# Patient Record
Sex: Male | Born: 1995
Health system: Southern US, Community
[De-identification: ages and names within clinical notes are randomized; demographics above are authoritative.]

## PROBLEM LIST (undated history)

## (undated) DIAGNOSIS — R0981 Nasal congestion: Secondary | ICD-10-CM

## (undated) DIAGNOSIS — J3501 Chronic tonsillitis: Secondary | ICD-10-CM

## (undated) DIAGNOSIS — F909 Attention-deficit hyperactivity disorder, unspecified type: Secondary | ICD-10-CM

## (undated) HISTORY — DX: Attention-deficit hyperactivity disorder, unspecified type: F90.9

## (undated) HISTORY — PX: TYMPANOSTOMY TUBE PLACEMENT: SHX32

---
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
## 2015-03-06 ENCOUNTER — Other Ambulatory Visit (HOSPITAL_COMMUNITY): Payer: Self-pay | Admitting: Interventional Radiology

## 2015-03-06 ENCOUNTER — Other Ambulatory Visit: Payer: Self-pay | Admitting: Internal Medicine

## 2015-03-06 DIAGNOSIS — N500 Atrophy of testis: Secondary | ICD-10-CM

## 2015-03-06 DIAGNOSIS — I861 Scrotal varices: Secondary | ICD-10-CM

## 2015-04-23 ENCOUNTER — Ambulatory Visit
Admission: RE | Admit: 2015-04-23 | Discharge: 2015-04-23 | Disposition: A | Discharge status: Home / Self Care | Payer: BLUE CROSS/BLUE SHIELD | Source: Ambulatory Visit | Attending: Internal Medicine | Admitting: Internal Medicine

## 2015-04-23 DIAGNOSIS — N500 Atrophy of testis: Secondary | ICD-10-CM

## 2015-04-23 DIAGNOSIS — I861 Scrotal varices: Secondary | ICD-10-CM

## 2015-04-23 NOTE — Progress Notes (Signed)
Chief Complaint: Chief Complaint  Patient presents with  . Follow-up    surveillance of Left Testicular Varicocele    History of Present Illness: Jay Ross is a 19 y.o. male seen previously on 12/24/2014 for evaluation of a symptomatic left-sided varicocele. Since his prior evaluation, the patient states that his symptoms of scrotal discomfort are significantly improved. He still experiences a "heavy" sensation in his scrotum after running or strenuous exercise which goes away with rest.  Past Medical History  Diagnosis Date  . ADHD (attention deficit hyperactivity disorder)   . OM (otitis media)   . Hx of varicella   . Injury, ankle 01/30/2012  . Minor head injury, initial encounter 02/22/2012  . Fracture of finger, distal phalanx, right, closed 12/02/2010    xray shows non displaced fx  will do referral    . Head injury without concussion or intracranial hemorrhage 01/30/2012    Past Surgical History  Procedure Laterality Date  . Myringotomy      Allergies: Adderall xr  Medications: Prior to Admission medications   Not on File     Family History  Problem Relation Age of Onset  . Allergies Brother   . Anxiety disorder Sister   . Other Father     back problems and sis  . ADD / ADHD Father     maternal aunt 2 cousins   . Asthma      History   Social History  . Marital Status: Single    Spouse Name: N/A  . Number of Children: N/A  . Years of Education: N/A   Social History Main Topics  . Smoking status: Never Smoker   . Smokeless tobacco: Never Used  . Alcohol Use: No  . Drug Use: No  . Sexual Activity: Not on file   Other Topics Concern  . 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  All other systems are negative.  Review of Systems  Vital Signs: BP 105/53 mmHg  Pulse 79  Temp(Src) 97.5 F (36.4 C) (Oral)  Resp 14  SpO2 98%  Physical Exam  Imaging: No results found.  Labs:  CBC:  Recent Labs  11/25/14 0942  WBC 4.8  HGB 14.9  HCT 43.7  PLT 178.0    COAGS: No results for input(s): INR, APTT in the last 8760 hours.  BMP:  Recent Labs  11/25/14 0942  NA 140  K 3.9  CL 103  CO2 30  GLUCOSE 73  BUN 10  CALCIUM 9.8  CREATININE 1.01    LIVER FUNCTION TESTS:  Recent Labs  11/25/14 0942  BILITOT 0.7  AST 16  ALT 10  ALKPHOS 75  PROT 6.5  ALBUMIN 4.4     Assessment and Plan:  [name] has significant improvement in symptoms of scrotal discomfort since prior evaluation 3 months ago. At this point, I did not recommend immediate treatment of the varicocele. I recommended that he continue to perform self-examination to assess the varicocele and size of the left testicle compared to the right. He currently has only mild discrepancy  in size of the testicles. He will contact me if he has any significant worsening of scrotal pain or notices further decrease in size of the left testicle compared to the right.   SignedIrish Lack T 04/23/2015, 5:22 PM     I spent a total of 15 minutes in face to face in clinical consultation, greater than 50% of which was counseling/coordinating care for left varicocele.

## 2015-07-02 ENCOUNTER — Other Ambulatory Visit (HOSPITAL_COMMUNITY): Payer: Self-pay | Admitting: Interventional Radiology

## 2015-07-02 DIAGNOSIS — I861 Scrotal varices: Secondary | ICD-10-CM

## 2015-07-10 ENCOUNTER — Inpatient Hospital Stay: Admission: RE | Admit: 2015-07-10 | Payer: BLUE CROSS/BLUE SHIELD | Source: Ambulatory Visit

## 2015-07-30 ENCOUNTER — Ambulatory Visit
Admission: RE | Admit: 2015-07-30 | Discharge: 2015-07-30 | Disposition: A | Discharge status: Home / Self Care | Payer: BLUE CROSS/BLUE SHIELD | Source: Ambulatory Visit | Attending: Interventional Radiology | Admitting: Interventional Radiology

## 2015-07-30 DIAGNOSIS — I861 Scrotal varices: Secondary | ICD-10-CM

## 2015-08-14 NOTE — Progress Notes (Signed)
Chief Complaint: Left varicocele.  History of Present Illness: Jay Ross is a 19 y.o. male previously evaluated for symptomatic left varicocele with associated evidence of mild testicular atrophy. He returns for follow-up. He states that the left varicocele remains asymptomatic and that he has had no recurrence of pain or discomfort. He is not experiencing any discomfort with exercise. By self examination, he has not noticed any difference in the varicocele or size of the left testicle.  Past Medical History  Diagnosis Date  . ADHD (attention deficit hyperactivity disorder)   . OM (otitis media)   . Hx of varicella   . Injury, ankle 01/30/2012  . Minor head injury, initial encounter 02/22/2012  . Fracture of finger, distal phalanx, right, closed 12/02/2010    xray shows non displaced fx  will do referral    . Head injury without concussion or intracranial hemorrhage 01/30/2012    Past Surgical History  Procedure Laterality Date  . Myringotomy      Allergies: Adderall xr  Medications: Prior to Admission medications   Not on File     Family History  Problem Relation Age of Onset  . Allergies Brother   . Anxiety disorder Sister   . Other Father     back problems and sis  . ADD / ADHD Father     maternal aunt 2 cousins   . Asthma      Social History   Social History  . Marital Status: Single    Spouse Name: N/A  . Number of Children: N/A  . Years of Education: N/A   Social History Main Topics  . Smoking status: Never Smoker   . Smokeless tobacco: Never Used  . Alcohol Use: No  . Drug Use: No  . Sexual Activity: Not on file   Other Topics Concern  . 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  All other systems are negative.  Review of Systems  Constitutional: Negative.   Gastrointestinal: Negative.   Genitourinary: Negative.     Vital Signs: BP 106/56 mmHg  Pulse 63  Temp(Src) 97.8 F (36.6 C) (Oral)  Resp 14  SpO2 98%  Physical Exam  Genitourinary: Penis normal.  Stable palpable 2+ left varicocele with distention by Valsalva maneuver.  Stable mild atrophy of left testicle by approximately 20% compared to the right side.    Mallampati Score:     Imaging: No results found.  Labs:  CBC:  Recent Labs  11/25/14 0942  WBC 4.8  HGB 14.9  HCT 43.7  PLT 178.0    COAGS: No results for input(s): INR, APTT in the last 8760 hours.  BMP:  Recent Labs  11/25/14 0942  NA 140  K 3.9  CL 103  CO2 30  GLUCOSE 73  BUN 10  CALCIUM 9.8  CREATININE 1.01    LIVER FUNCTION TESTS:  Recent Labs  11/25/14 0942  BILITOT 0.7  AST 16  ALT 10  ALKPHOS 75  PROT 6.5  ALBUMIN 4.4    TUMOR MARKERS: No results for input(s): AFPTM, CEA,  CA199, CHROMGRNA in the last 8760 hours.  Assessment and Plan:  Stable left varicocele and mild testicular atrophy. No need for current intervention. Gentle will call for an appointment if he notices any recurrence of pain, increased prominence of the varicocele or noticeable increased atrophy of the left testicle.  SignedIrish Lack T 08/14/2015, 12:30 PM   I spent a total of 10 Minutes in face to face in clinical consultation, greater than 50% of which was counseling/coordinating care for left varicocele.

## 2016-02-23 ENCOUNTER — Encounter: Payer: Self-pay | Admitting: Internal Medicine

## 2016-02-23 ENCOUNTER — Ambulatory Visit (INDEPENDENT_AMBULATORY_CARE_PROVIDER_SITE_OTHER): Payer: BLUE CROSS/BLUE SHIELD | Admitting: Internal Medicine

## 2016-02-23 VITALS — BP 124/72 | Temp 97.8°F | Wt 192.8 lb

## 2016-02-23 DIAGNOSIS — L089 Local infection of the skin and subcutaneous tissue, unspecified: Secondary | ICD-10-CM | POA: Diagnosis not present

## 2016-02-23 MED ORDER — DOXYCYCLINE HYCLATE 100 MG PO TABS: 100.0000 mg | ORAL_TABLET | Freq: Two times a day (BID) | ORAL | Status: DC

## 2016-02-23 NOTE — Progress Notes (Signed)
Pre visit review using our clinic review tool, if applicable. No additional management support is needed unless otherwise documented below in the visit note.  Chief Complaint  Patient presents with  . Insect Bite    HPI: Jay Ross 20 y.o.  Comes in for sda appt  Noted redness about 5-6 days ago ? Sting   No incident remembered but outdoors a lot runs.   Now spreading and more tender   Had itch at some point area of anticubital fossa  Right .  Had tatoo right forearm about 2 weeks ago and was painfuk but no redness there.  No rx  No k nown contactants  ROS: See pertinent positives and negatives per HPI. No fever systemic sx r   Past Medical History  Diagnosis Date  . ADHD (attention deficit hyperactivity disorder)   . OM (otitis media)   . Hx of varicella   . Injury, ankle 01/30/2012  . Minor head injury, initial encounter 02/22/2012  . Fracture of finger, distal phalanx, right, closed 12/02/2010    xray shows non displaced fx  will do referral    . Head injury without concussion or intracranial hemorrhage 01/30/2012    Family History  Problem Relation Age of Onset  . Allergies Brother   . Anxiety disorder Sister   . Other Father     back problems and sis  . ADD / ADHD Father     maternal aunt 2 cousins   . Asthma      Social History   Social History  . Marital Status: Single    Spouse Name: N/A  . Number of Children: N/A  . Years of Education: N/A   Social History Main Topics  . Smoking status: Never Smoker   . Smokeless tobacco: Never Used  . Alcohol Use: No  . Drug Use: No  . Sexual Activity: Not Asked   Other Topics Concern  . None   Social History Narrative   Lives with parents and siblings   Active in sports basketball   Sleep 11-7    [place]    HH of 5 - 2    Basketball   One semester [employer]   Working [employer]   To transfer [employer] in fall 16    No outpatient prescriptions prior to visit.   No facility-administered medications prior to  visit.     EXAM:  BP 124/72 mmHg  Temp(Src) 97.8 F (36.6 C) (Oral)  Wt 192 lb 12.8 oz (87.454 kg)  Body mass index is 24.1 kg/(m^2).  GENERAL: vitals reviewed and listed above, alert, oriented, appears well hydrated and in no acute   Right antecubital fossa  With erythema laterally  And medially a pin point  Area  3 cm and suppounding erythema  With some streadking area  tatoo  dital to that  About 4 cm and not redness around this area .  PSYCH: pleasant and cooperative, no obvious depression or anxiety  ASSESSMENT AND PLAN:  Discussed the following assessment and plan:  Skin infection poss sting or bite  - 6 days and streaking spread  add  antibiotic no abscess    Expectant management. And alarm sx to recheck  Etc    -Patient advised to return or notify health care team  if symptoms worsen ,persist or new concerns arise.  Patient Instructions  Antibiotic   This does look like a bit or sting  But since spreadings and tender add antibiotic   Doesn't seem related  to  The tatooing .  Fu i f    persistent or progressive      [name]. Adonna Horsley M.D.

## 2016-02-23 NOTE — Patient Instructions (Signed)
Antibiotic   This does look like a bit or sting  But since spreadings and tender add antibiotic   Doesn't seem related to  The tatooing .  Fu i f    persistent or progressive

## 2016-03-08 ENCOUNTER — Ambulatory Visit (INDEPENDENT_AMBULATORY_CARE_PROVIDER_SITE_OTHER): Payer: BLUE CROSS/BLUE SHIELD | Admitting: Internal Medicine

## 2016-03-08 ENCOUNTER — Encounter: Payer: Self-pay | Admitting: Internal Medicine

## 2016-03-08 VITALS — BP 128/78 | HR 83 | Temp 98.1°F | Wt 194.0 lb

## 2016-03-08 DIAGNOSIS — J0300 Acute streptococcal tonsillitis, unspecified: Secondary | ICD-10-CM | POA: Diagnosis not present

## 2016-03-08 DIAGNOSIS — M791 Myalgia: Secondary | ICD-10-CM | POA: Diagnosis not present

## 2016-03-08 DIAGNOSIS — R070 Pain in throat: Secondary | ICD-10-CM

## 2016-03-08 DIAGNOSIS — M609 Myositis, unspecified: Secondary | ICD-10-CM | POA: Diagnosis not present

## 2016-03-08 DIAGNOSIS — IMO0001 Reserved for inherently not codable concepts without codable children: Secondary | ICD-10-CM

## 2016-03-08 LAB — POCT INFLUENZA A/B
Influenza A, POC: NEGATIVE
Influenza B, POC: NEGATIVE

## 2016-03-08 LAB — POCT RAPID STREP A (OFFICE): Rapid Strep A Screen: POSITIVE — AB

## 2016-03-08 MED ORDER — AMOXICILLIN-POT CLAVULANATE 875-125 MG PO TABS: 1.0000 | ORAL_TABLET | Freq: Two times a day (BID) | ORAL | Status: DC

## 2016-03-08 NOTE — Progress Notes (Signed)
Chief Complaint  Patient presents with  . Fever  . Sore Throat    HPI: Jay Ross 20 y.o.  Comes in for sda appt  With mom  Onset  Yesterday    Mid day.  [employer] [address]  Fever 102 lat night  And given tylenol.   Some down .  No rashes.   A while ago   Right side more than left  Painful   Tonsil bleeding Feels bad no vomiting new rash  Cough worked yesterday     ROS: See pertinent positives and negatives per HPI. Drank water this am   Past Medical History  Diagnosis Date  . ADHD (attention deficit hyperactivity disorder)   . OM (otitis media)   . Hx of varicella   . Injury, ankle 01/30/2012  . Minor head injury, initial encounter 02/22/2012  . Fracture of finger, distal phalanx, right, closed 12/02/2010    xray shows non displaced fx  will do referral    . Head injury without concussion or intracranial hemorrhage 01/30/2012    Family History  Problem Relation Age of Onset  . Allergies Brother   . Anxiety disorder Sister   . Other Father     back problems and sis  . ADD / ADHD Father     maternal aunt 2 cousins   . Asthma      Social History   Social History  . Marital Status: Single    Spouse Name: N/A  . Number of Children: N/A  . Years of Education: N/A   Social History Main Topics  . Smoking status: Never Smoker   . Smokeless tobacco: Never Used  . Alcohol Use: No  . Drug Use: No  . Sexual Activity: Not Asked   Other Topics Concern  . 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 doxycycline (VIBRA-TABS) 100 MG tablet Take 1 tablet (100 mg total) by mouth 2 (two) times daily. 14 tablet 0   No facility-administered medications prior to visit.     EXAM:  BP 128/78 mmHg  Pulse 83  Temp(Src)  98.1 F (36.7 C) (Oral)  Wt 194 lb (87.998 kg)  SpO2 96%  Body mass index is 24.25 kg/(m^2).  GENERAL: vitals reviewed and listed above, alert, oriented, appears well hydrated  sick no n toxic  prefers to lay down   No drooling nl speech HEENT: atraumatic, conjunctiva  clear, no obvious abnormalities on inspection of external nose and earstms nl  OP :   Tonsil + 2 right is + 3 and asymmetric     No blood noted   Good airway no edema or bulging ant tonsillar pillar  No petechia  NECK: no obvious masses on inspection palpation  LUNGS: clear to auscultation bilaterally, no wheezes, rales or rhonchi, good air movement CV: HRRR, no clubbing cyanosis or  peripheral edema nl cap refill  Abdomen:  Sof,t normal bowel sounds without hepatosplenomegaly, no guarding rebound or masses no CVA tenderness MS: moves all extremities without noticeable focal  Abnormality Skin: normal capillary refill ,turgor , color: No acute rashes ,petechiae or bruising PSYCH: pleasant and cooperative,  RS pos flu negative   ASSESSMENT AND PLAN:  Discussed the following assessment and plan:  Acute streptococcal tonsillitis, not specified as recurrent or not - assymmetric at risk close fu advised  pt declined inj pcn or rocephen want to try oral med at this time  Throat pain in adult - Plan: POC Rapid Strep A  Myalgia and myositis - Plan: POC Influenza A/B   Expectant management.  -Patient advised to return or notify health care team  if symptoms worsen ,persist or new concerns arise.  Patient Instructions  You have strep tonsillitis   Warm gargles  and  Ibuprofen  400 -  600 mg     Or aleve  Or pain  Begin antibiotic right away .  Something in stomach  If not a lot better in 48 hours    Need other   Intervention..  If cannot  Tolerate  Consider injection or either penicillin  Or  Other .   If getting worse seen emergency  care of follow up.    [name]. Merilynn Haydu M.D.

## 2016-03-08 NOTE — Patient Instructions (Signed)
You have strep tonsillitis   Warm gargles  and  Ibuprofen  400 -  600 mg     Or aleve  Or pain  Begin antibiotic right away .  Something in stomach  If not a lot better in 48 hours    Need other   Intervention..  If cannot  Tolerate  Consider injection or either penicillin  Or  Other .   If getting worse seen emergency  care of follow up.

## 2016-03-10 ENCOUNTER — Encounter: Payer: Self-pay | Admitting: Internal Medicine

## 2016-03-10 ENCOUNTER — Telehealth: Payer: Self-pay | Admitting: Internal Medicine

## 2016-03-10 ENCOUNTER — Ambulatory Visit (INDEPENDENT_AMBULATORY_CARE_PROVIDER_SITE_OTHER): Payer: BLUE CROSS/BLUE SHIELD | Admitting: Internal Medicine

## 2016-03-10 VITALS — BP 120/78 | HR 56 | Temp 97.8°F | Ht 75.0 in | Wt 190.0 lb

## 2016-03-10 DIAGNOSIS — J020 Streptococcal pharyngitis: Secondary | ICD-10-CM | POA: Diagnosis not present

## 2016-03-10 DIAGNOSIS — J0300 Acute streptococcal tonsillitis, unspecified: Secondary | ICD-10-CM | POA: Diagnosis not present

## 2016-03-10 MED ORDER — PENICILLIN G BENZATHINE 1200000 UNIT/2ML IM SUSP
1.2000 10*6.[IU] | Freq: Once | INTRAMUSCULAR | Status: AC
  Administered 2016-03-10: 1.2 10*6.[IU] via INTRAMUSCULAR

## 2016-03-10 NOTE — Telephone Encounter (Signed)
Mom was to let Dr Fabian SharpPanosh know if pt was not better in 48 hours. The only thing that is better is the fever.  Pt still has all other symptoms and not better.  Mom states pt did not do the "shot", but they think it is a good idea now.  Mom wants to bring pt back today. Is that OK?

## 2016-03-10 NOTE — Patient Instructions (Signed)
Antibiotic shot today bicliin is a long acting penicillin .  Should help fight the infeciton quicker than the pills  .   Finish out the oral antibiotic but dont have to take tonights pill.   Recheck if not conitnuing to improve andalmost gone after weekend

## 2016-03-10 NOTE — Telephone Encounter (Signed)
Per our conversation.

## 2016-03-10 NOTE — Telephone Encounter (Signed)
Please have him come in as a work in  appt  This afternoon as I need to look at his throat to decide on which injection to give

## 2016-03-10 NOTE — Progress Notes (Signed)
Chief Complaint  Patient presents with  . Follow-up    Pt states fever is gone, throat is still as painful as last visit    HPI: Jay Ross 20 y.o.  hsa pos strep assymmetric tonsillitis   On oral meds   See message mom   Fever gone but throat still very bad no solid food for days   No rash    ROS: See pertinent positives and negatives per HPI.  Past Medical History  Diagnosis Date  . ADHD (attention deficit hyperactivity disorder)   . OM (otitis media)   . Hx of varicella   . Injury, ankle 01/30/2012  . Minor head injury, initial encounter 02/22/2012  . Fracture of finger, distal phalanx, right, closed 12/02/2010    xray shows non displaced fx  will do referral    . Head injury without concussion or intracranial hemorrhage 01/30/2012    Family History  Problem Relation Age of Onset  . Allergies Brother   . Anxiety disorder Sister   . Other Father     back problems and sis  . ADD / ADHD Father     maternal aunt 2 cousins   . Asthma      Social History   Social History  . Marital Status: Single    Spouse Name: N/A  . Number of Children: N/A  . Years of Education: N/A   Social History Main Topics  . Smoking status: Never Smoker   . Smokeless tobacco: Never Used  . Alcohol Use: No  . Drug Use: No  . Sexual Activity: Not Asked   Other Topics Concern  . 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 amoxicillin-clavulanate (AUGMENTIN) 875-125 MG tablet Take 1 tablet by mouth every 12 (twelve) hours. 20 tablet 0   No facility-administered medications prior to visit.     EXAM:  BP 120/78 mmHg  Pulse 56  Temp(Src) 97.8 F (36.6 C) (Oral)  Ht  (1.905 m)  Wt 190 lb (86.183 kg)  BMI 23.75  kg/m2  SpO2 99%  Body mass index is 23.75 kg/(m^2).  GENERAL: vitals reviewed and listed above, alert, oriented, appears well hydrated and in no acute distress still mouth full  And  Adenopathy bigger but looks better than 2 days ago  HEENT: atraumatic, conjunctiva  clear, no obvious abnormalities on inspection of external nose and ears OP : right tonsil 2-3 +  Less bulgking  Left 1+  Exudate on both  No edema  NECK:  3+2+ tender right ac node left 1+ tender noew LUNGS: clear to auscultation bilaterally, no wheezes, rales or rhonchi, good air movement Nl cap refill PSYCH: pleasant and cooperative, no obvious depression or anxiety  ASSESSMENT AND PLAN:  Discussed the following assessment and plan:  Acute streptococcal tonsillitis, not specified as recurrent or not  Streptococcal sore throat - Plan: penicillin g benzathine (BICILLIN LA) 1200000 UNIT/2ML injection 1.2 Million Units Still quite  Impressive  Enlargement although i dont think a abscess or cellulitis is now present. Difficulty taking pos  Bicillin and finish antibiotic  Fu if not resolving as planned or  relapsing sx  -Patient advised to return or notify health care team  if symptoms worsen ,persist or new concerns arise.  Patient Instructions  Antibiotic shot today bicliin is a long acting penicillin .  Should help fight the infeciton quicker than the pills  .   Finish out the oral antibiotic but dont have to take tonights pill.   Recheck if not conitnuing to improve andalmost gone after weekend     Wanda K. Panosh M.D.

## 2016-03-10 NOTE — Telephone Encounter (Signed)
Pt has been scheduled.  °

## 2016-05-26 ENCOUNTER — Encounter: Payer: Self-pay | Admitting: Family Medicine

## 2016-05-26 ENCOUNTER — Ambulatory Visit (INDEPENDENT_AMBULATORY_CARE_PROVIDER_SITE_OTHER): Payer: BLUE CROSS/BLUE SHIELD | Admitting: Family Medicine

## 2016-05-26 VITALS — BP 120/60 | HR 105 | Temp 98.8°F | Ht 75.0 in | Wt 187.4 lb

## 2016-05-26 DIAGNOSIS — J0391 Acute recurrent tonsillitis, unspecified: Secondary | ICD-10-CM

## 2016-05-26 DIAGNOSIS — J039 Acute tonsillitis, unspecified: Secondary | ICD-10-CM

## 2016-05-26 DIAGNOSIS — I889 Nonspecific lymphadenitis, unspecified: Secondary | ICD-10-CM | POA: Diagnosis not present

## 2016-05-26 LAB — POCT RAPID STREP A (OFFICE): Rapid Strep A Screen: NEGATIVE

## 2016-05-26 LAB — POCT MONO (EPSTEIN BARR VIRUS): Mono, POC: NEGATIVE

## 2016-05-26 MED ORDER — KETOROLAC TROMETHAMINE 60 MG/2ML IM SOLN
60.0000 mg | Freq: Once | INTRAMUSCULAR | Status: AC
  Administered 2016-05-26: 60 mg via INTRAMUSCULAR

## 2016-05-26 MED ORDER — PREDNISONE 20 MG PO TABS: 40.0000 mg | ORAL_TABLET | Freq: Every day | ORAL | 0 refills | Status: AC

## 2016-05-26 MED ORDER — IBUPROFEN 600 MG PO TABS: 600.0000 mg | ORAL_TABLET | Freq: Three times a day (TID) | ORAL | 0 refills | Status: AC | PRN

## 2016-05-26 MED ORDER — LIDOCAINE VISCOUS 2 % MT SOLN: 10.0000 mL | Freq: Four times a day (QID) | OROMUCOSAL | 0 refills | Status: AC | PRN

## 2016-05-26 MED ORDER — AMOXICILLIN 500 MG PO CAPS
500.0000 mg | ORAL_CAPSULE | Freq: Two times a day (BID) | ORAL | 0 refills | Status: AC
Start: 2016-05-26 — End: 2016-06-05

## 2016-05-26 NOTE — Patient Instructions (Addendum)
A few things to remember from today's visit:   Acute tonsillitis, unspecified etiology - Plan: POC Rapid Strep A, Culture, Group A Strep, POC Mono (Epstein Barr Virus), lidocaine (XYLOCAINE) 2 % solution, ibuprofen (ADVIL,MOTRIN) 600 MG tablet, predniSONE (DELTASONE) 20 MG tablet  Recurrent tonsillitis  Toradol 60 mg in office, can take Ibuprofen in 6 hours.   Symptomatic treatment: Over the counter Acetaminophen 500 mg and/or Ibuprofen (400-600 mg) if there is not contraindications; you can alternate in between both every 4-6 hours. Gargles with saline water and throat lozenges might also help. Cold fluids.    Seek prompt medical evaluation if you are having difficulty breathing, mouth swelling, throat closing up, not able to swallow liquids (drooling), skin rash/bruising, or worsening symptoms.  Please follow up in 2 weeks if not any better.  Mono painting strep culture pending.  Please be sure medication list is accurate. If a new problem present, please set up appointment sooner than planned today.

## 2016-05-26 NOTE — Progress Notes (Signed)
HPI:  ACUTE VISIT:  Chief Complaint  Patient presents with  . tonsils swollen    same thing that happened back in May of 2017.    [name] is a 20 y.o. male, who is here today with his mother complaining of sore throat that started late last night.  +Subjective fever, body aches, and chills. This is his second episode of tonsillitis this year, last May 2017. He usually just 1-2 episodes per year but this year seems to be more frequent.  03/08/16 rapid strep +.  No cough.  No nasal congestion, rhinorrhea, sore throat, or post nasal drainage.  Denies chest pain, dyspnea, or wheezing.   No Hx of recent travel. No sick contact. No known insect bite. No Hx of allergies.  OTC medications for this problem: Tylenol today morning and Ibuprofen last night. Symptoms getting worse.     Review of Systems  Constitutional: Positive for appetite change, chills, fatigue and fever.  HENT: Positive for sore throat and trouble swallowing. Negative for drooling, ear pain, facial swelling, mouth sores, postnasal drip, rhinorrhea and voice change.   Eyes: Negative for discharge, redness and itching.  Respiratory: Negative for cough, chest tightness, shortness of breath, wheezing and stridor.   Cardiovascular: Negative for chest pain and leg swelling.  Gastrointestinal: Negative for abdominal pain, diarrhea, nausea and vomiting.  Musculoskeletal: Positive for myalgias. Negative for back pain, joint swelling and neck pain.  Skin: Negative for color change and rash.  Neurological: Negative for syncope, weakness and headaches.  Psychiatric/Behavioral: Negative for confusion. The patient is not nervous/anxious.       No current outpatient prescriptions on file prior to visit.   No current facility-administered medications on file prior to visit.      Past Medical History:  Diagnosis Date  . ADHD (attention deficit hyperactivity disorder)   . Fracture of finger,  distal phalanx, right, closed 12/02/2010   xray shows non displaced fx  will do referral    . Head injury without concussion or intracranial hemorrhage 01/30/2012  . Hx of varicella   . Injury, ankle 01/30/2012  . Minor head injury, initial encounter 02/22/2012  . OM (otitis media)    Allergies  Allergen Reactions  . Adderall Xr [Amphetamine-Dextroamphet Er] Other (See Comments)    Crying  immediate mood changes ?2011.   Ok on ritalin la    Social History   Social History  . Marital status: Single    Spouse name: N/A  . Number of children: N/A  . Years of education: N/A   Social History Main Topics  . Smoking status: Never Smoker  . Smokeless tobacco: Never Used  . Alcohol use No  . Drug use: No  . Sexual activity: Not Asked   Other Topics Concern  . 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  O2 sat RA 98%    Physical Exam  Constitutional: He is oriented to person, place, and time. He appears well-developed and well-nourished. He appears distressed (mild.).  HENT:  Head:  Atraumatic.  Right Ear: External ear and ear canal normal. Tympanic membrane is scarred.  Left Ear: Tympanic membrane, external ear and ear canal normal.  Nose: Right sinus exhibits no maxillary sinus tenderness and no frontal sinus tenderness. Left sinus exhibits no maxillary sinus tenderness and no frontal sinus tenderness.  Mouth/Throat: Uvula is midline and mucous membranes are normal. No uvula swelling. Oropharyngeal exudate and posterior oropharyngeal erythema present. No posterior oropharyngeal edema or tonsillar abscesses.  Eyes: Conjunctivae and EOM are normal. Pupils are equal, round, and  reactive to light.  Neck: Normal range of motion. No tracheal deviation present.  Cardiovascular: Regular rhythm.  Tachycardia present.   No murmur heard. Respiratory: Effort normal and breath sounds normal. No respiratory distress.  Lymphadenopathy:       Head (right side): Submandibular adenopathy present.       Head (left side): Submandibular adenopathy present.    He has cervical adenopathy.       Right cervical: Superficial cervical and posterior cervical adenopathy present.       Left cervical: Superficial cervical and posterior cervical adenopathy present.  Neurological: He is alert and oriented to person, place, and time. He has normal strength. Coordination and gait normal.  Skin: Skin is warm. No erythema.  Psychiatric: He has a normal mood and affect.      ASSESSMENT AND PLAN:     Jay Ross was seen today for tonsils swollen.  Diagnoses and all orders for this visit:  Acute tonsillitis, unspecified etiology  We discussed possible causes. Rapid strep was negative, still empiric Abx treatment started today, presumable strep. After verbal consent, Toradol 60 mg IM here in the office. Bed rest, plenty of PO fluids. He will continue symptomatic treatment at home.  Clearly instructed about warning signs.   -     POC Rapid Strep A -     Culture, Group A Strep -     POC Mono (Epstein Barr Virus) -     lidocaine (XYLOCAINE) 2 % solution; Use as directed 10 mLs in the mouth or throat every 6 (six) hours as needed for mouth pain. -     ibuprofen (ADVIL,MOTRIN) 600 MG tablet; Take 1 tablet (600 mg total) by mouth every 8 (eight) hours as needed. -     predniSONE (DELTASONE) 20 MG tablet; Take 2 tablets (40 mg total) by mouth daily with breakfast. -     ketorolac (TORADOL) injection 60 mg; Inject 2 mLs (60 mg total) into the muscle once. -     amoxicillin (AMOXIL) 500 MG capsule; Take 1 capsule (500 mg total) by mouth 2 (two) times daily.  Recurrent tonsillitis  Mother will  let me know if she is interested in ENT referral.  Cervical lymphadenitis -     POC Mono (Epstein Barr Virus)           -[name] and mother were advised to return or notify a doctor immediately if symptoms worsen or persist or new concerns arise, they voice understanding.       Azeneth Carbonell G. [place], MD  [facility]. [place] office.

## 2016-05-26 NOTE — Progress Notes (Signed)
Pre visit review using our clinic review tool, if applicable. No additional management support is needed unless otherwise documented below in the visit note. 

## 2016-05-28 LAB — CULTURE, GROUP A STREP: ORGANISM ID, BACTERIA: NORMAL

## 2016-09-24 DIAGNOSIS — J3501 Chronic tonsillitis: Secondary | ICD-10-CM

## 2016-09-24 HISTORY — DX: Chronic tonsillitis: J35.01

## 2016-10-05 ENCOUNTER — Encounter (HOSPITAL_BASED_OUTPATIENT_CLINIC_OR_DEPARTMENT_OTHER): Payer: Self-pay | Admitting: *Deleted

## 2016-10-05 DIAGNOSIS — R0981 Nasal congestion: Secondary | ICD-10-CM

## 2016-10-05 HISTORY — DX: Nasal congestion: R09.81

## 2016-10-07 ENCOUNTER — Ambulatory Visit: Payer: Self-pay | Admitting: Otolaryngology

## 2016-10-07 DIAGNOSIS — J3503 Chronic tonsillitis and adenoiditis: Secondary | ICD-10-CM | POA: Diagnosis not present

## 2016-10-07 NOTE — H&P (Signed)
  Otolaryngology Clinic Note  HPI:    Jay Ross is a 20 y.o. male patient of WANDA K PANOSH, MD for preop assessment prior to tonsillectomy, possible adenoidectomy.  He has a long history of tonsillitis and strep throat.  He has not had any additional episodes since we saw him in August.  He does have very large tonsils.  We are planning on doing tonsillectomy, possible adenoidectomy next week.  I discussed the surgery in detail including risks and complications.  Questions were answered and informed consent was obtained.  I gave him a prescription for hydrocodone liquid and postoperative instructions. PMH/Meds/All/SocHx/FamHx/ROS:   History reviewed. No pertinent past medical history.  History reviewed. No pertinent surgical history.  No family history of bleeding disorders, wound healing problems or difficulty with anesthesia.   Social History   Social History  . Marital status: Single    Spouse name: N/A  . Number of children: N/A  . Years of education: N/A   Occupational History  . Not on file.   Social History Main Topics  . Smoking status: Never Smoker  . Smokeless tobacco: Never Used     Comment: value added r/t chart correction/ medical mreocrd merge.  Originally documented 05/28/15 by Sophia G Branch, RTR  . Alcohol use Yes     Comment: 2 drinks/day or fewer  . Drug use: Unknown  . Sexual activity: Not on file   Other Topics Concern  . Not on file   Social History Narrative  . No narrative on file     Current Outpatient Prescriptions:  .  amoxicillin (AMOXIL) 500 MG capsule, Take 1 capsule (500 mg total) by mouth 3 times daily., Disp: 30 capsule, Rfl: 3 .  HYDROcodone-acetaminophen 7.5-325 mg/15 mL solution, Take 10-20 mLs by mouth every 4 (four) hours as needed., Disp: 300 mL, Rfl: 0 .  multivitamin (MULTIVITAMIN) per tablet, Take 1 tablet by mouth., Disp: , Rfl:   A complete ROS was performed with pertinent positives/negatives noted in the HPI. The  remainder of the ROS are negative.    Physical Exam:    There were no vitals taken for this visit. He is tall, thin, and healthy.  Mental status is appropriate.  He hears well in conversational speech.  Voice is clear and respirations unlabored through the nose.  The head is atraumatic and neck supple.  Cranial nerves intact.  Ear canals are clear with normal drums.  Anterior nose is moist and patent.  Oral cavity shows teeth in good repair.  Oropharynx shows very large 3+ tonsils with normal soft palate and no velopharyngeal insufficiency.  Neck without adenopathy. Lungs: Clear to auscultation Heart: Regular rate and rhythm without murmurs Abdomen: Soft, active Extremities: Normal configuration Neurologic: Symmetric, grossly intact.      Impression & Plans:   Chronic adenotonsillitis including strep.  Plan: We will proceed with tonsillectomy, possible adenoidectomy next week.  I will see him back one week following surgery.  I gave him written postoperative instructions.   Brenya Taulbee Thaddeus Lorie Melichar, MD  10/07/2016 

## 2016-10-11 ENCOUNTER — Encounter (HOSPITAL_BASED_OUTPATIENT_CLINIC_OR_DEPARTMENT_OTHER)
Admission: RE | Disposition: A | Discharge status: Home / Self Care | Payer: Self-pay | Source: Ambulatory Visit | Attending: Otolaryngology

## 2016-10-11 ENCOUNTER — Ambulatory Visit (HOSPITAL_BASED_OUTPATIENT_CLINIC_OR_DEPARTMENT_OTHER): Payer: BLUE CROSS/BLUE SHIELD | Admitting: Anesthesiology

## 2016-10-11 ENCOUNTER — Ambulatory Visit (HOSPITAL_BASED_OUTPATIENT_CLINIC_OR_DEPARTMENT_OTHER)
Admission: RE | Admit: 2016-10-11 | Discharge: 2016-10-11 | Disposition: A | Discharge status: Home / Self Care | Payer: BLUE CROSS/BLUE SHIELD | Source: Ambulatory Visit | Attending: Otolaryngology | Admitting: Otolaryngology

## 2016-10-11 ENCOUNTER — Encounter (HOSPITAL_BASED_OUTPATIENT_CLINIC_OR_DEPARTMENT_OTHER): Payer: Self-pay | Admitting: Anesthesiology

## 2016-10-11 DIAGNOSIS — J3501 Chronic tonsillitis: Secondary | ICD-10-CM | POA: Diagnosis not present

## 2016-10-11 DIAGNOSIS — R51 Headache: Secondary | ICD-10-CM | POA: Diagnosis not present

## 2016-10-11 DIAGNOSIS — F909 Attention-deficit hyperactivity disorder, unspecified type: Secondary | ICD-10-CM | POA: Insufficient documentation

## 2016-10-11 DIAGNOSIS — R112 Nausea with vomiting, unspecified: Secondary | ICD-10-CM | POA: Diagnosis not present

## 2016-10-11 DIAGNOSIS — J353 Hypertrophy of tonsils with hypertrophy of adenoids: Secondary | ICD-10-CM | POA: Diagnosis not present

## 2016-10-11 DIAGNOSIS — J351 Hypertrophy of tonsils: Secondary | ICD-10-CM | POA: Diagnosis not present

## 2016-10-11 DIAGNOSIS — J3503 Chronic tonsillitis and adenoiditis: Secondary | ICD-10-CM | POA: Diagnosis not present

## 2016-10-11 HISTORY — DX: Nasal congestion: R09.81

## 2016-10-11 HISTORY — DX: Chronic tonsillitis: J35.01

## 2016-10-11 HISTORY — PX: TONSILLECTOMY AND ADENOIDECTOMY: SHX28

## 2016-10-11 SURGERY — TONSILLECTOMY AND ADENOIDECTOMY
Anesthesia: General | Site: Throat | Laterality: Bilateral

## 2016-10-11 MED ORDER — IBUPROFEN 100 MG/5ML PO SUSP: 400.0000 mg | Freq: Four times a day (QID) | ORAL | Status: DC | PRN

## 2016-10-11 MED ORDER — DEXAMETHASONE SODIUM PHOSPHATE 4 MG/ML IJ SOLN
INTRAMUSCULAR | Status: DC | PRN
  Administered 2016-10-11: 10 mg via INTRAVENOUS

## 2016-10-11 MED ORDER — MEPERIDINE HCL 25 MG/ML IJ SOLN: 6.2500 mg | INTRAMUSCULAR | Status: DC | PRN

## 2016-10-11 MED ORDER — DEXAMETHASONE SODIUM PHOSPHATE 10 MG/ML IJ SOLN: 10.0000 mg | Freq: Once | INTRAMUSCULAR | Status: DC

## 2016-10-11 MED ORDER — LIDOCAINE HCL (PF) 0.5 % IJ SOLN
INTRAMUSCULAR | Status: AC
  Filled 2016-10-11: qty 50

## 2016-10-11 MED ORDER — PROPOFOL 10 MG/ML IV BOLUS
INTRAVENOUS | Status: DC | PRN
  Administered 2016-10-11: 200 mg via INTRAVENOUS

## 2016-10-11 MED ORDER — HYDROMORPHONE HCL 1 MG/ML IJ SOLN
INTRAMUSCULAR | Status: AC
  Filled 2016-10-11: qty 1

## 2016-10-11 MED ORDER — PROMETHAZINE HCL 25 MG/ML IJ SOLN: 6.2500 mg | INTRAMUSCULAR | Status: DC | PRN

## 2016-10-11 MED ORDER — DEXAMETHASONE SODIUM PHOSPHATE 10 MG/ML IJ SOLN
INTRAMUSCULAR | Status: AC
  Filled 2016-10-11: qty 1

## 2016-10-11 MED ORDER — MIDAZOLAM HCL 2 MG/2ML IJ SOLN
1.0000 mg | INTRAMUSCULAR | Status: DC | PRN
  Administered 2016-10-11: 2 mg via INTRAVENOUS

## 2016-10-11 MED ORDER — ONDANSETRON HCL 4 MG/2ML IJ SOLN
INTRAMUSCULAR | Status: AC
  Filled 2016-10-11: qty 2

## 2016-10-11 MED ORDER — FENTANYL CITRATE (PF) 100 MCG/2ML IJ SOLN
50.0000 ug | INTRAMUSCULAR | Status: AC | PRN
  Administered 2016-10-11: 100 ug via INTRAVENOUS
  Administered 2016-10-11 (×2): 25 ug via INTRAVENOUS

## 2016-10-11 MED ORDER — ONDANSETRON HCL 4 MG PO TABS: 4.0000 mg | ORAL_TABLET | ORAL | Status: DC | PRN

## 2016-10-11 MED ORDER — LACTATED RINGERS IV SOLN
INTRAVENOUS | Status: DC
  Administered 2016-10-11: 08:00:00 via INTRAVENOUS

## 2016-10-11 MED ORDER — FENTANYL CITRATE (PF) 100 MCG/2ML IJ SOLN
INTRAMUSCULAR | Status: AC
  Filled 2016-10-11: qty 2

## 2016-10-11 MED ORDER — LIDOCAINE-EPINEPHRINE (PF) 1.5 %-1:200000 IJ SOLN
INTRAMUSCULAR | Status: DC | PRN
Start: 2016-10-11 — End: 2016-10-11
  Administered 2016-10-11: 11 mL

## 2016-10-11 MED ORDER — ONDANSETRON HCL 4 MG/2ML IJ SOLN: 4.0000 mg | INTRAMUSCULAR | Status: DC | PRN

## 2016-10-11 MED ORDER — BIOTIN 5 MG PO CAPS: 1.0000 | ORAL_CAPSULE | Freq: Every morning | ORAL | 0 refills | Status: DC

## 2016-10-11 MED ORDER — LIDOCAINE 2% (20 MG/ML) 5 ML SYRINGE
INTRAMUSCULAR | Status: AC
  Filled 2016-10-11: qty 5

## 2016-10-11 MED ORDER — HYDROCODONE-ACETAMINOPHEN 7.5-325 MG/15ML PO SOLN
10.0000 mL | ORAL | Status: DC | PRN
  Administered 2016-10-11 (×2): 15 mL via ORAL
  Filled 2016-10-11 (×2): qty 15

## 2016-10-11 MED ORDER — ONDANSETRON HCL 4 MG/2ML IJ SOLN
INTRAMUSCULAR | Status: DC | PRN
  Administered 2016-10-11: 4 mg via INTRAVENOUS

## 2016-10-11 MED ORDER — MIDAZOLAM HCL 2 MG/2ML IJ SOLN
INTRAMUSCULAR | Status: AC
  Filled 2016-10-11: qty 2

## 2016-10-11 MED ORDER — HYDROCODONE-ACETAMINOPHEN 7.5-325 MG PO TABS: 1.0000 | ORAL_TABLET | Freq: Once | ORAL | Status: DC | PRN

## 2016-10-11 MED ORDER — SUCCINYLCHOLINE CHLORIDE 20 MG/ML IJ SOLN: INTRAMUSCULAR | Status: DC | PRN

## 2016-10-11 MED ORDER — SCOPOLAMINE 1 MG/3DAYS TD PT72: 1.0000 | MEDICATED_PATCH | Freq: Once | TRANSDERMAL | Status: DC | PRN

## 2016-10-11 MED ORDER — DEXTROSE-NACL 5-0.45 % IV SOLN
INTRAVENOUS | Status: DC
Start: 2016-10-11 — End: 2016-10-11
  Administered 2016-10-11: 11:00:00 via INTRAVENOUS

## 2016-10-11 MED ORDER — HYDROMORPHONE HCL 1 MG/ML IJ SOLN
0.2500 mg | INTRAMUSCULAR | Status: DC | PRN
  Administered 2016-10-11: 0.5 mg via INTRAVENOUS
  Administered 2016-10-11: 0.25 mg via INTRAVENOUS

## 2016-10-11 MED ORDER — SUCCINYLCHOLINE CHLORIDE 200 MG/10ML IV SOSY
PREFILLED_SYRINGE | INTRAVENOUS | Status: DC | PRN
  Administered 2016-10-11: 100 mg via INTRAVENOUS

## 2016-10-11 MED ORDER — LIDOCAINE HCL (CARDIAC) 10 MG/ML IV SOLN
INTRAVENOUS | Status: DC | PRN
  Administered 2016-10-11: 80 mg via INTRAVENOUS

## 2016-10-11 SURGICAL SUPPLY — 35 items
CANISTER SUCT 1200ML W/VALVE (MISCELLANEOUS) ×2 IMPLANT
CATH ROBINSON RED A/P 10FR (CATHETERS) ×2 IMPLANT
CLEANER CAUTERY TIP 5X5 PAD (MISCELLANEOUS) IMPLANT
COAGULATOR SUCT SWTCH 10FR 6 (ELECTROSURGICAL) ×2 IMPLANT
COVER MAYO STAND STRL (DRAPES) ×2 IMPLANT
DECANTER SPIKE VIAL GLASS SM (MISCELLANEOUS) IMPLANT
ELECT COATED BLADE 2.86 ST (ELECTRODE) IMPLANT
ELECT REM PT RETURN 9FT ADLT (ELECTROSURGICAL) ×2
ELECT REM PT RETURN 9FT PED (ELECTROSURGICAL)
ELECTRODE REM PT RETRN 9FT PED (ELECTROSURGICAL) IMPLANT
ELECTRODE REM PT RTRN 9FT ADLT (ELECTROSURGICAL) ×1 IMPLANT
FORCEPS TISS BAYO ENTCEPS (INSTRUMENTS) ×2 IMPLANT
GLOVE BIOGEL PI IND STRL 7.0 (GLOVE) ×1 IMPLANT
GLOVE BIOGEL PI INDICATOR 7.0 (GLOVE) ×1
GLOVE ECLIPSE 6.5 STRL STRAW (GLOVE) ×2 IMPLANT
GLOVE ECLIPSE 8.0 STRL XLNG CF (GLOVE) ×2 IMPLANT
GOWN STRL REUS W/ TWL LRG LVL3 (GOWN DISPOSABLE) ×1 IMPLANT
GOWN STRL REUS W/ TWL XL LVL3 (GOWN DISPOSABLE) ×1 IMPLANT
GOWN STRL REUS W/TWL LRG LVL3 (GOWN DISPOSABLE) ×1
GOWN STRL REUS W/TWL XL LVL3 (GOWN DISPOSABLE) ×1
MARKER SKIN DUAL TIP RULER LAB (MISCELLANEOUS) ×2 IMPLANT
NEEDLE SPNL 22GX3.5 QUINCKE BK (NEEDLE) ×2 IMPLANT
NS IRRIG 1000ML POUR BTL (IV SOLUTION) ×2 IMPLANT
PAD CLEANER CAUTERY TIP 5X5 (MISCELLANEOUS)
PENCIL FOOT CONTROL (ELECTRODE) IMPLANT
SHEET MEDIUM DRAPE 40X70 STRL (DRAPES) ×2 IMPLANT
SPONGE GAUZE 4X4 12PLY STER LF (GAUZE/BANDAGES/DRESSINGS) ×2 IMPLANT
SPONGE TONSIL 1 RF SGL (DISPOSABLE) IMPLANT
SPONGE TONSIL 1.25 RF SGL STRG (GAUZE/BANDAGES/DRESSINGS) IMPLANT
SYR BULB 3OZ (MISCELLANEOUS) ×2 IMPLANT
SYR CONTROL 10ML LL (SYRINGE) ×2 IMPLANT
TOWEL OR 17X24 6PK STRL BLUE (TOWEL DISPOSABLE) ×2 IMPLANT
TUBE CONNECTING 20X1/4 (TUBING) ×2 IMPLANT
TUBE SALEM SUMP 12R W/ARV (TUBING) IMPLANT
TUBE SALEM SUMP 16 FR W/ARV (TUBING) ×2 IMPLANT

## 2016-10-11 NOTE — Op Note (Signed)
10/11/2016  9:22 AM    Jay Ross, Jay Ross  [id number]   Pre-Op Dx:  Obstructive adenotonsillar hypertrophy. Recurrent tonsillitis  Post-op Dx: Same  Proc: Tonsillectomy, adenoid ablation   Surg:  Flo ShanksWOLICKI, [name]  Anes:  GOT  EBL:  Minimal  Comp:  None  Findings:  3+ tonsils with normal soft palate. 30% residual adenoids.  Procedure:  With the patient in a comfortable supine position,  general orotracheal anesthesia was induced without difficulty.   A routine surgical timeout was performed.   At an appropriate level, the patient was turned 90 away from anesthesia and placed in Trendelenburg.  A clean preparation and draping was accomplished.  Taking care to protect lips, teeth, and endotracheal tube, the Crowe-Davis mouth gag was introduced, expanded for visualization, and suspended from the Mayo stand in the standard fashion.  The findings were as described above.  Palate  retractor  and mirror were used to examine the nasopharynx with the findings as described above.   Anterior nose was examined with a nasal speculum with the findings as described above.  1.5% Xylocaine with 1:200,000 epinephrine, 10 cc's, was infiltrated into the peritonsillar planes on both sides for intraoperative hemostasis.  Several minutes were allowed for this to take effect.   A red rubber catheter was passed through the nose and out the mouth to serve as a Producer, television/film/videopalate retractor.  Using suction cautery and indirect visualization, the residual adenoids were ablated.  Hemostasis was observed.  Beginning on the  RIGHT side, the tonsil was grasped and retracted medially.  The mucosa over the anterior and superior poles was coagulated and then cut down to the capsule of the tonsil using the Microline thermal forceps.  Using the forceps tip as a blunt dissector, the tonsil was dissected from its muscular fossa from anterior to posterior and from superior to inferior.  Fibrous bands were lysed as necessary.   Crossing vessels were coagulated as identified.  The tonsil was removed in its entirety as determined by examination of both tonsil and fossa.  A small additional quantity of cautery rendered the fossa hemostatic.    After completing the 1st tonsillectomy, the 2nd one was performed in identical fashion.  After completing both tonsillectomies and rendering the oropharynx hemostatic,the nasopharynx was inspected and hemostasis was observed.    At this point the palate retractor and mouthgag were relaxed for several minutes.  Upon reexpansion,  Hemostasis was again observed.  An orogastric tube was briefly placed and a small amount of clear secretions was evacuated.  This tube was removed.  The mouth gag and palate retractor were relaxed and removed.  The dental status was intact.   At this point the procedure was completed.  The patient was returned to anesthesia, awakened, extubated, and transferred to recovery in stable condition.  Dispo:  OR to PACU.   Will observe for six hours, overnight if necessary and then discharged to home in care of family.  Plan:  Analgesia, hydration, limited activity for two weeks.  Advance diet as comfortable.  Return to school or work at 10 days.  Jay RicherWOLICKI,  Jay Hollenback T.  MD.

## 2016-10-11 NOTE — Interval H&P Note (Signed)
History and Physical Interval Note:  10/11/2016 8:30 AM  Jay Ross  has presented today for surgery, with the diagnosis of chronic recurrent tonsillitis  The various methods of treatment have been discussed with the patient and family. After consideration of risks, benefits and other options for treatment, the patient has consented to  Procedure(s): TONSILLECTOMY AND possible ADENOIDECTOMY (N/A) as a surgical intervention .  The patient's history has been re-reviewed, patient re-examined, no change in status, stable for surgery.  I have re-reviewed the patient's chart and labs.  Questions were answered to the patient's satisfaction.     Flo ShanksWOLICKI, Cady Hafen

## 2016-10-11 NOTE — H&P (View-Only) (Signed)
  Otolaryngology Clinic Note  HPI:    Jay Ross is a 20 y.o. male patient of Madelin HeadingsWANDA K PANOSH, MD for preop assessment prior to tonsillectomy, possible adenoidectomy.  He has a long history of tonsillitis and strep throat.  He has not had any additional episodes since we saw him in August.  He does have very large tonsils.  We are planning on doing tonsillectomy, possible adenoidectomy next week.  I discussed the surgery in detail including risks and complications.  Questions were answered and informed consent was obtained.  I gave him a prescription for hydrocodone liquid and postoperative instructions. PMH/Meds/All/SocHx/FamHx/ROS:   History reviewed. No pertinent past medical history.  History reviewed. No pertinent surgical history.  No family history of bleeding disorders, wound healing problems or difficulty with anesthesia.   Social History   Social History  . Marital status: Single    Spouse name: N/A  . Number of children: N/A  . Years of education: N/A   Occupational History  . Not on file.   Social History Main Topics  . Smoking status: Never Smoker  . Smokeless tobacco: Never Used     Comment: value added r/t chart correction/ medical mreocrd merge.  Originally documented 05/28/15 by Annia FriendlySophia G Branch, RTR  . Alcohol use Yes     Comment: 2 drinks/day or fewer  . Drug use: Unknown  . Sexual activity: Not on file   Other Topics Concern  . Not on file   Social History Narrative  . No narrative on file     Current Outpatient Prescriptions:  .  amoxicillin (AMOXIL) 500 MG capsule, Take 1 capsule (500 mg total) by mouth 3 times daily., Disp: 30 capsule, Rfl: 3 .  HYDROcodone-acetaminophen 7.5-325 mg/15 mL solution, Take 10-20 mLs by mouth every 4 (four) hours as needed., Disp: 300 mL, Rfl: 0 .  multivitamin (MULTIVITAMIN) per tablet, Take 1 tablet by mouth., Disp: , Rfl:   A complete ROS was performed with pertinent positives/negatives noted in the HPI. The  remainder of the ROS are negative.    Physical Exam:    There were no vitals taken for this visit. He is tall, thin, and healthy.  Mental status is appropriate.  He hears well in conversational speech.  Voice is clear and respirations unlabored through the nose.  The head is atraumatic and neck supple.  Cranial nerves intact.  Ear canals are clear with normal drums.  Anterior nose is moist and patent.  Oral cavity shows teeth in good repair.  Oropharynx shows very large 3+ tonsils with normal soft palate and no velopharyngeal insufficiency.  Neck without adenopathy. Lungs: Clear to auscultation Heart: Regular rate and rhythm without murmurs Abdomen: Soft, active Extremities: Normal configuration Neurologic: Symmetric, grossly intact.      Impression & Plans:   Chronic adenotonsillitis including strep.  Plan: We will proceed with tonsillectomy, possible adenoidectomy next week.  I will see him back one week following surgery.  I gave him written postoperative instructions.   Fernande BoydenKarol Thaddeus Lil Lepage, MD  10/07/2016

## 2016-10-11 NOTE — Anesthesia Procedure Notes (Signed)
Procedure Name: Intubation Date/Time: 10/11/2016 8:43 AM Performed by: Gar GibbonKEETON, Jujuan Dugo S Pre-anesthesia Checklist: Patient identified, Emergency Drugs available, Suction available and Patient being monitored Patient Re-evaluated:Patient Re-evaluated prior to inductionOxygen Delivery Method: Circle system utilized Preoxygenation: Pre-oxygenation with 100% oxygen Intubation Type: IV induction Ventilation: Mask ventilation without difficulty Laryngoscope Size: Mac and 3 Grade View: Grade II Tube type: Oral Tube size: 8.0 mm Number of attempts: 1 Airway Equipment and Method: Stylet and Oral airway Placement Confirmation: ETT inserted through vocal cords under direct vision,  positive ETCO2 and breath sounds checked- equal and bilateral Tube secured with: Tape Dental Injury: Teeth and Oropharynx as per pre-operative assessment

## 2016-10-11 NOTE — Transfer of Care (Signed)
Immediate Anesthesia Transfer of Care Note  Patient: Jay GashLuke T Breidenbach  Procedure(s) Performed: Procedure(s): TONSILLECTOMY AND ADENOIDECTOMY (Bilateral)  Patient Location: PACU  Anesthesia Type:General  Level of Consciousness: awake, patient cooperative and confused  Airway & Oxygen Therapy: Patient Spontanous Breathing and Patient connected to face mask oxygen  Post-op Assessment: Report given to RN and Post -op Vital signs reviewed and stable  Post vital signs: Reviewed and stable  Last Vitals:  Vitals:   10/11/16 0755  BP: 128/75  Resp: 20  Temp: 36.6 C    Last Pain:  Vitals:   10/11/16 0755  TempSrc: Oral         Complications: No apparent anesthesia complications

## 2016-10-11 NOTE — Anesthesia Postprocedure Evaluation (Signed)
Anesthesia Post Note  Patient: Jay Ross  Procedure(s) Performed: Procedure(s) (LRB): TONSILLECTOMY AND ADENOIDECTOMY (Bilateral)  Patient location during evaluation: PACU Anesthesia Type: General Level of consciousness: awake and alert and oriented Pain management: pain level controlled Vital Signs Assessment: post-procedure vital signs reviewed and stable Respiratory status: spontaneous breathing, nonlabored ventilation and respiratory function stable Cardiovascular status: blood pressure returned to baseline and stable Postop Assessment: no signs of nausea or vomiting Anesthetic complications: no    Last Vitals:  Vitals:   10/11/16 0945 10/11/16 1000  BP: (!) 154/99 (!) 152/102  Pulse: 68 69  Resp: (!) 8 11  Temp:      Last Pain:  Vitals:   10/11/16 1000  TempSrc:   PainSc: 6                  [name] A.

## 2016-10-11 NOTE — Anesthesia Preprocedure Evaluation (Addendum)
Anesthesia Evaluation  Patient identified by MRN, date of birth, ID band Patient awake    Reviewed: Allergy & Precautions, NPO status , Patient's Chart, lab work & pertinent test results  Airway Mallampati: I  TM Distance: >3 FB Neck ROM: Full    Dental no notable dental hx. (+) Teeth Intact, Caps,    Pulmonary  Chronic recurrent tonsillitis   Pulmonary exam normal breath sounds clear to auscultation       Cardiovascular negative cardio ROS Normal cardiovascular exam Rhythm:Regular Rate:Normal     Neuro/Psych  Headaches, PSYCHIATRIC DISORDERS ADHD   GI/Hepatic negative GI ROS, (+)     substance abuse  alcohol use,   Endo/Other  Testicular atrophy  Renal/GU negative Renal ROS  negative genitourinary   Musculoskeletal negative musculoskeletal ROS (+)   Abdominal   Peds  Hematology negative hematology ROS (+)   Anesthesia Other Findings   Reproductive/Obstetrics                            Anesthesia Physical Anesthesia Plan  ASA: II  Anesthesia Plan: General   Post-op Pain Management:    Induction: Intravenous  Airway Management Planned: Oral ETT  Additional Equipment:   Intra-op Plan:   Post-operative Plan: Extubation in OR  Informed Consent: I have reviewed the patients History and Physical, chart, labs and discussed the procedure including the risks, benefits and alternatives for the proposed anesthesia with the patient or authorized representative who has indicated his/her understanding and acceptance.   Dental advisory given  Plan Discussed with: CRNA, Anesthesiologist and Surgeon  Anesthesia Plan Comments:         Anesthesia Quick Evaluation

## 2016-10-11 NOTE — Discharge Instructions (Signed)
°  Post Anesthesia Home Care Instructions  Activity: Get plenty of rest for the remainder of the day. A responsible adult should stay with you for 24 hours following the procedure.  For the next 24 hours, DO NOT: -Drive a car -Advertising copywriterperate machinery -Drink alcoholic beverages -Take any medication unless instructed by your physician -Make any legal decisions or sign important papers.  Meals: Start with liquid foods such as gelatin or soup. Progress to regular foods as tolerated. Avoid greasy, spicy, heavy foods. If nausea and/or vomiting occur, drink only clear liquids until the nausea and/or vomiting subsides. Call your physician if vomiting continues.  Special Instructions/Symptoms: Your throat may feel dry or sore from the anesthesia or the breathing tube placed in your throat during surgery. If this causes discomfort, gargle with warm salt water. The discomfort should disappear within 24 hours.  If you had a scopolamine patch placed behind your ear for the management of post- operative nausea and/or vomiting:  1. The medication in the patch is effective for 72 hours, after which it should be removed.  Wrap patch in a tissue and discard in the trash. Wash hands thoroughly with soap and water. 2. You may remove the patch earlier than 72 hours if you experience unpleasant side effects which may include dry mouth, dizziness or visual disturbances. 3. Avoid touching the patch. Wash your hands with soap and water after contact with the patch.   Call your surgeon if you experience:   1.  Fever over 101.0. 2.  Inability to urinate. 3.  Nausea and/or vomiting. 4.  Extreme swelling or bruising at the surgical site. 5.  Continued bleeding from the incision. 6.  Increased pain, redness or drainage from the incision. 7.  Problems related to your pain medication. 8.  Any problems and/or concernsSee tonsillectomy instructions from office please

## 2016-10-12 ENCOUNTER — Encounter (HOSPITAL_BASED_OUTPATIENT_CLINIC_OR_DEPARTMENT_OTHER): Payer: Self-pay | Admitting: Otolaryngology

## 2016-10-27 ENCOUNTER — Encounter: Payer: Self-pay | Admitting: Interventional Radiology

## 2016-11-12 DIAGNOSIS — S01511A Laceration without foreign body of lip, initial encounter: Secondary | ICD-10-CM | POA: Diagnosis not present

## 2016-11-12 DIAGNOSIS — S00501A Unspecified superficial injury of lip, initial encounter: Secondary | ICD-10-CM | POA: Diagnosis not present

## 2017-04-25 NOTE — Progress Notes (Signed)
Chief Complaint  Patient presents with  . Acute Visit    clogged ear (right )  . Tremors    HPI: Jay Ross 21 y.o.  sda  Last visit  A year ago   Onset about 2-3 days ago of right ear the felt clogged. This was not after an event might of had some discomfort at some point nothing in his ear feels like it could be wax. He did have pinkeye week and a half ago but no ear pain denies any sinus pain or chronic congestion today. Also for the last few years is noticed some shaking of both hands left more than right with intention but doesn't really affect which he does. He denies it's related to anxiety medications usually more in the afternoon and in the morning. No other neurologic symptoms. He does have a history of ADD uncertainly family history of tremor. ROS: See pertinent positives and negatives per HPI.  Past Medical History:  Diagnosis Date  . ADHD (attention deficit hyperactivity disorder)    no current med.  . Chronic tonsillitis 09/2016   recurrent  . Stuffy nose 10/05/2016    Family History  Problem Relation Age of Onset  . Other Father        back problems and sis  . ADD / ADHD Father        maternal aunt 2 cousins   . Allergies Brother   . Anxiety disorder Sister   . Asthma Unknown     Social History   Social History  . Marital status: Single    Spouse name: N/A  . Number of children: N/A  . Years of education: N/A   Social History Main Topics  . Smoking status: Never Smoker  . Smokeless tobacco: Never Used  . Alcohol use Yes     Comment: occasionally  . Drug use: No  . Sexual activity: Not Asked   Other Topics Concern  . None   Social History Narrative   Lives with parents and siblings   Active in sports basketball   Sleep 11-7    [employer]    HH of 5 - 2    Basketball   One semester [employer]   Working [employer]   To transfer [employer] in fall 16    Outpatient Medications Prior to Visit  Medication Sig Dispense Refill  . Biotin (BIOTIN  5000) 5 MG CAPS Take 1 capsule (5 mg total) by mouth every morning.  0  . Multiple Vitamin (MULTIVITAMIN) tablet Take 1 tablet by mouth daily.     No facility-administered medications prior to visit.      EXAM:  BP 110/80 (BP Location: Right Arm, Patient Position: Sitting, Cuff Size: Normal)   Pulse 90   Temp 97.7 F (36.5 C) (Oral)   Wt 187 lb 6.4 oz (85 kg)   BMI 23.42 kg/m   Body mass index is 23.42 kg/m.  GENERAL: vitals reviewed and listed above, alert, oriented, appears well hydrated and in no acute distress HEENT: atraumatic, conjunctiva  clear, no obvious abnormalities on inspection of external nose and earsLeft EAC and TM normal right EAC is clear TM is distorted with light reflex splayed but not bulging possible fluid versus adhesion. Bony landmarks are normal. The whole area is pink but not read nor acutely inflamed. OP : no lesion edema or exudate  NECK: no obvious masses on inspection palpation  CV: HRRR, no clubbing cyanosis or  peripheral edema nl cap refill  MS:  moves all extremities without noticeable focal  Abnormality Neurologic is nonfocal left hand with intention you can be a fine tremor but he can suppress it no weakness or atrophy is noted. PSYCH: pleasant and cooperative, no obvious depression or anxiety See hearing assessment  15 right 10 left x 20 at high frequency   ASSESSMENT AND PLAN:  Discussed the following assessment and plan:  Decreased hearing, right - mild sicrep on testing  ssuspect conductive issue temorary  OME (otitis media with effusion), right ? - seetext .  Tremor of left hand Suspect tremor is possibly essential familial with no other findings and noninvasive intrusive discussed findings if getting worse more evaluation.   Expectant management.advise fu if not better  And reexamine ear .  -Patient advised to return or notify health care team  if symptoms worsen ,persist or new concerns arise.  Patient Instructions  Some fluid behind  her eardrums not an acute ear infection. I would have you try some decongestant like  Sudafed during the day or similar although( it could make  tremor little worse) Also begin a medicine nasal cortisone such as Flonase every day for the next week. If you're getting pain or symptoms are not resolving or improving in the next week to 10 days and contact us for recheck. Deep diving pressure changes at this right ear may not be able to equilibrate is easy since it is congested.  At this time I don't see evidence of a bacterial infection. But sometimes if the symptoms do not resolve we may add antibiotics.  I think the tremor may be a familial or essential tremor which can run in families if it is progressive associated with weakness or interference with her daily functions contact us for follow-up.    Neta MendsWanda K. Panosh M.D.

## 2017-04-26 ENCOUNTER — Ambulatory Visit (INDEPENDENT_AMBULATORY_CARE_PROVIDER_SITE_OTHER): Payer: BLUE CROSS/BLUE SHIELD | Admitting: Internal Medicine

## 2017-04-26 ENCOUNTER — Encounter: Payer: Self-pay | Admitting: Internal Medicine

## 2017-04-26 VITALS — BP 110/80 | HR 90 | Temp 97.7°F | Wt 187.4 lb

## 2017-04-26 DIAGNOSIS — H9191 Unspecified hearing loss, right ear: Secondary | ICD-10-CM

## 2017-04-26 DIAGNOSIS — R251 Tremor, unspecified: Secondary | ICD-10-CM | POA: Diagnosis not present

## 2017-04-26 DIAGNOSIS — H6591 Unspecified nonsuppurative otitis media, right ear: Secondary | ICD-10-CM | POA: Diagnosis not present

## 2017-04-26 NOTE — Patient Instructions (Addendum)
Some fluid behind her eardrums not an acute ear infection. I would have you try some decongestant like  Sudafed during the day or similar although( it could make  tremor little worse) Also begin a medicine nasal cortisone such as Flonase every day for the next week. If you're getting pain or symptoms are not resolving or improving in the next week to 10 days and contact us for recheck. Deep diving pressure changes at this right ear may not be able to equilibrate is easy since it is congested.  At this time I don't see evidence of a bacterial infection. But sometimes if the symptoms do not resolve we may add antibiotics.  I think the tremor may be a familial or essential tremor which can run in families if it is progressive associated with weakness or interference with her daily functions contact us for follow-up.

## 2018-02-21 DIAGNOSIS — H6691 Otitis media, unspecified, right ear: Secondary | ICD-10-CM | POA: Diagnosis not present

## 2018-03-15 DIAGNOSIS — M9908 Segmental and somatic dysfunction of rib cage: Secondary | ICD-10-CM | POA: Diagnosis not present

## 2018-03-15 DIAGNOSIS — M9902 Segmental and somatic dysfunction of thoracic region: Secondary | ICD-10-CM | POA: Diagnosis not present

## 2018-03-15 DIAGNOSIS — R51 Headache: Secondary | ICD-10-CM | POA: Diagnosis not present

## 2018-03-15 DIAGNOSIS — M9901 Segmental and somatic dysfunction of cervical region: Secondary | ICD-10-CM | POA: Diagnosis not present

## 2018-03-22 DIAGNOSIS — M9908 Segmental and somatic dysfunction of rib cage: Secondary | ICD-10-CM | POA: Diagnosis not present

## 2018-03-22 DIAGNOSIS — M9901 Segmental and somatic dysfunction of cervical region: Secondary | ICD-10-CM | POA: Diagnosis not present

## 2018-03-22 DIAGNOSIS — M9902 Segmental and somatic dysfunction of thoracic region: Secondary | ICD-10-CM | POA: Diagnosis not present

## 2018-03-22 DIAGNOSIS — R51 Headache: Secondary | ICD-10-CM | POA: Diagnosis not present

## 2018-03-29 DIAGNOSIS — R51 Headache: Secondary | ICD-10-CM | POA: Diagnosis not present

## 2018-03-29 DIAGNOSIS — M9902 Segmental and somatic dysfunction of thoracic region: Secondary | ICD-10-CM | POA: Diagnosis not present

## 2018-03-29 DIAGNOSIS — M9901 Segmental and somatic dysfunction of cervical region: Secondary | ICD-10-CM | POA: Diagnosis not present

## 2018-03-29 DIAGNOSIS — M9908 Segmental and somatic dysfunction of rib cage: Secondary | ICD-10-CM | POA: Diagnosis not present

## 2018-05-05 DIAGNOSIS — I861 Scrotal varices: Secondary | ICD-10-CM | POA: Diagnosis not present

## 2018-08-14 NOTE — Progress Notes (Signed)
Chief Complaint  Patient presents with  . Mass    chest under the ribs, x 1 year, wants TDAP    HPI: Jay Ross 22 y.o. come in for   Noted  Lumpiness lower ant chest  Area  For months    Gets popping in that area but denies injury or sig pain discomfort .   He has lost weight from 220 down to 185 with exercise weights and eating healthy  And feels fine otherwise  Sis is having baby needs tdap   ROS: See pertinent positives and negatives per HPI. No cp sob   Past Medical History:  Diagnosis Date  . ADHD (attention deficit hyperactivity disorder)    no current med.  . Chronic tonsillitis 09/2016   recurrent  . Stuffy nose 10/05/2016    Family History  Problem Relation Age of Onset  . Other Father        back problems and sis  . ADD / ADHD Father        maternal aunt 2 cousins   . Allergies Brother   . Anxiety disorder Sister   . Asthma Unknown     Social History   Socioeconomic History  . Marital status: Single    Spouse name: Not on file  . Number of children: Not on file  . Years of education: Not on file  . Highest education level: Not on file  Occupational History  . Not on file  Social Needs  . Financial resource strain: Not on file  . Food insecurity:    Worry: Not on file    Inability: Not on file  . Transportation needs:    Medical: Not on file    Non-medical: Not on file  Tobacco Use  . Smoking status: Never Smoker  . Smokeless tobacco: Never Used  Substance and Sexual Activity  . Alcohol use: Yes    Comment: occasionally  . Drug use: No  . Sexual activity: Not on file  Lifestyle  . Physical activity:    Days per week: Not on file    Minutes per session: Not on file  . Stress: Not on file  Relationships  . Social connections:    Talks on phone: Not on file    Gets together: Not on file    Attends religious service: Not on file    Active member of club or organization: Not on file    Attends meetings of clubs or organizations:  Not on file    Relationship status: Not on file  Other Topics Concern  . 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   No facility-administered medications prior to visit.      EXAM:  BP 122/60 (BP Location: Left Arm, Patient Position: Sitting, Cuff Size: Normal)   Pulse 65   Ht 6\' 3"  (1.905 m)   Wt 179 lb 12.8 oz (81.6 kg)   SpO2 98%   BMI 22.47 kg/m   Body mass index is 22.47 kg/m.  GENERAL: vitals reviewed and listed above, alert, oriented, appears well hydrated and in no acute distress HEENT: atraumatic, conjunctiva  clear, no obvious abnormalities on  inspection of external nose and ears  NECK: no obvious masses on inspection palpation  LUNGS: clear to auscultation bilaterally, no wheezes, rales or rhonchi, good air movement CV: HRRR, no clubbing cyanosis or  peripheral edema nl cap refill  Chest  Noted anterior cc area of diffuse swelling non tender t 10? Scaphoid  At lower sternum .  abd no masses  Bilaterally left more than right  No sco;lisosi tenderness   MS: moves all extremities without noticeable focal  abnormality PSYCH: pleasant and cooperative, Lab Results  Component Value Date   WBC 4.8 11/25/2014   HGB 14.9 11/25/2014   HCT 43.7 11/25/2014   PLT 178.0 11/25/2014   GLUCOSE 73 11/25/2014   CHOL 111 11/25/2014   TRIG 88.0 11/25/2014   HDL 37.10 (L) 11/25/2014   LDLCALC 56 11/25/2014   ALT 10 11/25/2014   AST 16 11/25/2014   NA 140 11/25/2014   K 3.9 11/25/2014   CL 103 11/25/2014   CREATININE 1.01 11/25/2014   BUN 10 11/25/2014   CO2 30 11/25/2014   TSH 1.45 11/25/2014   BP Readings from Last 3 Encounters:  08/15/18 122/60  04/26/17 110/80  10/11/16 131/67    ASSESSMENT AND PLAN:  Discussed the following  assessment and plan:  Chest wall deformity ? - Plan: Ambulatory referral to Sports Medicine  Need for influenza vaccination - Plan: Flu Vaccine QUAD 36+ mos IM  Need for Tdap vaccination - Plan: Tdap vaccine greater than or equal to 7yo IM See below suspect cc issues  With popping and weight loss more obvious  Consider imagine to confirm   Will refer to SM  No alarm features today -Patient advised to return or notify health care team  if  new concerns arise.  Patient Instructions  Suspect this is a chest wall deformity  Related to cartilage joints   And may be most notable since you lost  the weight .   Will get Dr Lucretia Field to evaluate poss imagine [place] vs ct and any advice about follow up and  Continued weight training   Flu vaccine and Tdap today.     [name]. Alayha Babineaux M.D.

## 2018-08-15 ENCOUNTER — Ambulatory Visit (INDEPENDENT_AMBULATORY_CARE_PROVIDER_SITE_OTHER): Payer: BLUE CROSS/BLUE SHIELD | Admitting: Internal Medicine

## 2018-08-15 ENCOUNTER — Encounter: Payer: Self-pay | Admitting: Internal Medicine

## 2018-08-15 VITALS — BP 122/60 | HR 65 | Ht 75.0 in | Wt 179.8 lb

## 2018-08-15 DIAGNOSIS — Z23 Encounter for immunization: Secondary | ICD-10-CM

## 2018-08-15 DIAGNOSIS — M954 Acquired deformity of chest and rib: Secondary | ICD-10-CM

## 2018-08-15 NOTE — Patient Instructions (Addendum)
Suspect this is a chest wall deformity  Related to cartilage joints   And may be most notable since you lost  the weight .   Will get Dr Lucretia Field to evaluate poss imagine [place] vs ct and any advice about follow up and  Continued weight training   Flu vaccine and Tdap today.

## 2018-09-05 ENCOUNTER — Ambulatory Visit: Payer: BLUE CROSS/BLUE SHIELD | Admitting: Family Medicine

## 2018-09-06 ENCOUNTER — Encounter: Payer: Self-pay | Admitting: Internal Medicine

## 2018-09-06 ENCOUNTER — Ambulatory Visit (INDEPENDENT_AMBULATORY_CARE_PROVIDER_SITE_OTHER): Payer: BLUE CROSS/BLUE SHIELD | Admitting: Internal Medicine

## 2018-09-06 VITALS — BP 122/78 | HR 65 | Wt 183.8 lb

## 2018-09-06 DIAGNOSIS — R350 Frequency of micturition: Secondary | ICD-10-CM

## 2018-09-06 DIAGNOSIS — R3915 Urgency of urination: Secondary | ICD-10-CM | POA: Diagnosis not present

## 2018-09-06 LAB — POCT URINALYSIS DIPSTICK
BILIRUBIN UA: NEGATIVE
Glucose, UA: NEGATIVE
KETONES UA: NEGATIVE
Leukocytes, UA: NEGATIVE
Nitrite, UA: NEGATIVE
PH UA: 7 (ref 5.0–8.0)
Protein, UA: NEGATIVE
RBC UA: NEGATIVE
Spec Grav, UA: 1.01 (ref 1.010–1.025)
UROBILINOGEN UA: 0.2 U/dL

## 2018-09-06 NOTE — Patient Instructions (Addendum)
I think  You are drinking excessive water at this time.    Although some diseases can cause this   Track intake and   Output  And   Then back off on   Intake especially after 7 on evening .  And see if gets better   If not get back with me and    further evaluation   Urinary Frequency, Adult Urinary frequency means urinating more often than usual. People with urinary frequency urinate at least 8 times in 24 hours, even if they drink a normal amount of fluid. Although they urinate more often than normal, the total amount of urine produced in a day may be normal. Urinary frequency is also called pollakiuria. What are the causes? This condition may be caused by:  A urinary tract infection.  Obesity.  Bladder problems, such as bladder stones.  Caffeine or alcohol.  Eating food or drinking fluids that irritate the bladder. These include coffee, tea, soda, artificial sweeteners, citrus, tomato-based foods, and chocolate.  Certain medicines, such as medicines that help the body get rid of extra fluid (diuretics).  Muscle or nerve weakness.  Overactive bladder.  Chronic diabetes.  Interstitial cystitis.  In men, problems with the prostate, such as an enlarged prostate.  In women, pregnancy.  In some cases, the cause may not be known. What increases the risk? This condition is more likely to develop in:  Women who have gone through menopause.  Men with prostate problems.  People with a disease or injury that affects the nerves or spinal cord.  People who have or have had a condition that affects the brain, such as a stroke.  What are the signs or symptoms? Symptoms of this condition include:  Feeling an urgent need to urinate often. The stress and anxiety of needing to find a bathroom quickly can make this urge worse.  Urinating 8 or more times in 24 hours.  Urinating as often as every 1 to 2 hours.  How is this diagnosed? This condition is diagnosed based on your  symptoms, your medical history, and a physical exam. You may have tests, such as:  Blood tests.  Urine tests.  Imaging tests, such as X-rays or ultrasounds.  A bladder test.  A test of your neurological system. This is the body system that senses the need to urinate.  A test to check for problems in the urethra and bladder called cystoscopy.  You may also be asked to keep a bladder diary. A bladder diary is a record of what you eat and drink, how often you urinate, and how much you urinate. You may need to see a health care provider who specializes in conditions of the urinary tract (urologist) or kidneys (nephrologist). How is this treated? Treatment for this condition depends on the cause. Sometimes the condition goes away on its own and treatment is not necessary. If treatment is needed, it may include:  Taking medicine.  Learning exercises that strengthen the muscles that help control urination.  Following a bladder training program. This may include: ? Learning to delay going to the bathroom. ? Double urinating (voiding). This helps if you are not completely emptying your bladder. ? Scheduled voiding.  Making diet changes, such as: ? Avoiding caffeine. ? Drinking fewer fluids, especially alcohol. ? Not drinking in the evening. ? Not having foods or drinks that may irritate the bladder. ? Eating foods that help prevent or ease constipation. Constipation can make this condition worse.  Having the  nerves in your bladder stimulated. There are two options for stimulating the nerves to your bladder: ? Outpatient electrical nerve stimulation. This is done by your health care provider. ? Surgery to implant a bladder pacemaker. The pacemaker helps to control the urge to urinate.  Follow these instructions at home:  Keep a bladder diary if told to by your health care provider.  Take over-the-counter and prescription medicines only as told by your health care provider.  Do any  exercises as told by your health care provider.  Follow a bladder training program as told by your health care provider.  Make any recommended diet changes.  Keep all follow-up visits as told by your health care provider. This is important. Contact a health care provider if:  You start urinating more often.  You feel pain or irritation when you urinate.  You notice blood in your urine.  Your urine looks cloudy.  You develop a fever.  You begin vomiting. Get help right away if:  You are unable to urinate. This information is not intended to replace advice given to you by your health care provider. Make sure you discuss any questions you have with your health care provider. Document Released: 08/07/2009 Document Revised: 11/12/2015 Document Reviewed: 05/07/2015 Elsevier Interactive Patient Education  2018 [employer]  Urine test   Is  Clear today.

## 2018-09-06 NOTE — Progress Notes (Signed)
Chief Complaint  Patient presents with  . Urinary Urgency    Urinary frequency and urgency x 5 months. Denies burning with urination and lower abd pain. No discoloration.    HPI: Jay Ross 22 y.o. come in for  above    5-6 months of   Frequency up  to 8 x over hours   With  polyuria    And almost hard to hold urine    With this. No change stream pain  Fever  .   Hematuria or hx  Of   same .    Has been working out  For the same amount of time  And drinking much more water because of  Reading that is  Advisable .    2 8-12 ox in am and then 5 over work time   2 over work out  1-2 hours  And then in evening   2 25 ox ice waters before bed!   Sleep 12- am .   Has young baby at home   No illness  No sti sx   Some caffiene 2 in am no sig etoh or other liquids  ROS: See pertinent positives and negatives per HPI. No feverresp abd pain sx   Past Medical History:  Diagnosis Date  . ADHD (attention deficit hyperactivity disorder)    no current med.  . Chronic tonsillitis 09/2016   recurrent  . Stuffy nose 10/05/2016    Family History  Problem Relation Age of Onset  . Other Father        back problems and sis  . ADD / ADHD Father        maternal aunt 2 cousins   . Allergies Brother   . Anxiety disorder Sister   . Asthma Unknown     Social History   Socioeconomic History  . Marital status: Single    Spouse name: Not on file  . Number of children: Not on file  . Years of education: Not on file  . Highest education level: Not on file  Occupational History  . Not on file  Social Needs  . Financial resource strain: Not on file  . Food insecurity:    Worry: Not on file    Inability: Not on file  . Transportation needs:    Medical: Not on file    Non-medical: Not on file  Tobacco Use  . Smoking status: Never Smoker  . Smokeless tobacco: Never Used  Substance and Sexual Activity  . Alcohol use: Yes    Comment: occasionally  . Drug use: No  . Sexual activity: Not  on file  Lifestyle  . Physical activity:    Days per week: Not on file    Minutes per session: Not on file  . Stress: Not on file  Relationships  . Social connections:    Talks on phone: Not on file    Gets together: Not on file    Attends religious service: Not on file    Active member of club or organization: Not on file    Attends meetings of clubs or organizations: Not on file    Relationship status: Not on file  Other Topics Concern  . 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   No facility-administered medications prior to visit.      EXAM:  BP 122/78 (BP Location: Right Arm, Patient Position: Sitting, Cuff Size: Normal)   Pulse 65   Wt 183 lb 12.8 oz (83.4 kg)   BMI 22.97 kg/m   Body mass index is 22.97 kg/m. Sipping on water straw  GENERAL: vitals reviewed and listed above, alert, oriented, appears well hydrated and in no acute distress HEENT: atraumatic, conjunctiva  clear, no obvious abnormalities on inspection of external nose and ears  NECK: no obvious masses on inspection palpation  LUNGS: clear to auscultation bilaterally, no wheezes, rales or rhonchi, good air movement CV: HRRR, no clubbing cyanosis or  peripheral edema nl cap refill  Abdomen:  Sof,t normal bowel sounds without hepatosplenomegaly, no guarding rebound or masses no CVA tenderness  MS: moves all extremities without noticeable focal  abnormality PSYCH: pleasant and cooperative, no obvious depression or anxiety  BP Readings from Last 3 Encounters:  09/06/18 122/78  08/15/18 122/60  04/26/17 110/80   Wt Readings from Last 3 Encounters:  09/06/18 183 lb 12.8 oz (83.4 kg)  08/15/18 179 lb 12.8 oz (81.6 kg)  04/26/17 187 lb 6.4 oz (85 kg)     ASSESSMENT AND PLAN:  Discussed the following assessment and plan:  Urinary urgency - Plan: POC Urinalysis Dipstick  Urinary frequency - Plan: POC Urinalysis Dipstick Highly suspicious for  Excessive water intake  Related to change  In intake when he began to do workouts      does not have  Inc thirst but does have polyuria  No evidence of hyperglycemia  And ua is clear  Disc monitoring I and O  And then back off water  Esp  After 7 pm   If sx not  Better or  Other alarm sx discussed we can do more evaluation .  Blood work and urine concentration  Spec etc     Patient agrees  To plan  Doubt DI or bladder   Obstruction. Etc   -Patient advised to return or notify health care team  if  new concerns arise.  Patient Instructions  I think  You are drinking excessive water at this time.    Although some diseases can cause this   Track intake and   Output  And   Then back off on   Intake especially after 7 on evening .  And see if gets better   If not get back with me and    further evaluation   Urinary Frequency, Adult Urinary frequency means urinating more often than usual. People with urinary frequency urinate at least 8 times in 24 hours, even if they drink a normal amount of fluid. Although they urinate more often than normal, the total amount of urine produced in a day may be normal. Urinary frequency is also called pollakiuria. What are the causes? This condition may be caused by:  A urinary tract infection.  Obesity.  Bladder problems, such as bladder stones.  Caffeine or alcohol.  Eating food or drinking fluids that irritate the bladder. These include coffee, tea, soda, artificial sweeteners, citrus, tomato-based foods, and chocolate.  Certain medicines, such as medicines that help the body get rid of extra fluid (diuretics).  Muscle or nerve weakness.  Overactive bladder.  Chronic diabetes.  Interstitial cystitis.  In men, problems with the prostate, such as an  enlarged prostate.  In women, pregnancy.  In some cases, the cause may not be known. What increases the risk? This condition is more likely to develop in:  Women who have gone through menopause.  Men with prostate problems.  People with a disease or injury that affects the nerves or spinal cord.  People who have or have had a condition that affects the brain, such as a stroke.  What are the signs or symptoms? Symptoms of this condition include:  Feeling an urgent need to urinate often. The stress and anxiety of needing to find a bathroom quickly can make this urge worse.  Urinating 8 or more times in 24 hours.  Urinating as often as every 1 to 2 hours.  How is this diagnosed? This condition is diagnosed based on your symptoms, your medical history, and a physical exam. You may have tests, such as:  Blood tests.  Urine tests.  Imaging tests, such as X-rays or ultrasounds.  A bladder test.  A test of your neurological system. This is the body system that senses the need to urinate.  A test to check for problems in the urethra and bladder called cystoscopy.  You may also be asked to keep a bladder diary. A bladder diary is a record of what you eat and drink, how often you urinate, and how much you urinate. You may need to see a health care provider who specializes in conditions of the urinary tract (urologist) or kidneys (nephrologist). How is this treated? Treatment for this condition depends on the cause. Sometimes the condition goes away on its own and treatment is not necessary. If treatment is needed, it may include:  Taking medicine.  Learning exercises that strengthen the muscles that help control urination.  Following a bladder training program. This may include: ? Learning to delay going to the bathroom. ? Double urinating (voiding). This helps if you are not completely emptying your bladder. ? Scheduled voiding.  Making diet changes, such as: ? Avoiding  caffeine. ? Drinking fewer fluids, especially alcohol. ? Not drinking in the evening. ? Not having foods or drinks that may irritate the bladder. ? Eating foods that help prevent or ease constipation. Constipation can make this condition worse.  Having the nerves in your bladder stimulated. There are two options for stimulating the nerves to your bladder: ? Outpatient electrical nerve stimulation. This is done by your health care provider. ? Surgery to implant a bladder pacemaker. The pacemaker helps to control the urge to urinate.  Follow these instructions at home:  Keep a bladder diary if told to by your health care provider.  Take over-the-counter and prescription medicines only as told by your health care provider.  Do any exercises as told by your health care provider.  Follow a bladder training program as told by your health care provider.  Make any recommended diet changes.  Keep all follow-up visits as told by your health care provider. This is important. Contact a health care provider if:  You start urinating more often.  You feel pain or irritation when you urinate.  You notice blood in your urine.  Your urine looks cloudy.  You develop a fever.  You begin vomiting. Get help right away if:  You are unable to urinate. This information is not intended to replace advice given to you by your health care provider. Make sure you discuss any questions you have with your health care provider. Document Released: 08/07/2009 Document Revised: 11/12/2015 Document Reviewed: 05/07/2015  Risk analyst Patient Education  [employer].  Urine test   Is  Clear today.     [name]. Daxton Nydam M.D.

## 2018-09-27 NOTE — Progress Notes (Deleted)
Tawana ScaleZach Madyn Ivins D.O. [facility] [address] [place], [place] [address] Phone: [phone] Subjective:    I'm seeing this patient by the request  of:    CC:   DVV:[id number]:Subjective  Jay Ross is a 22 y.o. male coming in with complaint of ***  Onset-  Location Duration-  Character- Aggravating factors- Reliving factors-  Therapies tried-  Severity-     Past Medical History:  Diagnosis Date  . ADHD (attention deficit hyperactivity disorder)    no current med.  . Chronic tonsillitis 09/2016   recurrent  . Stuffy nose 10/05/2016   Past Surgical History:  Procedure Laterality Date  . IR GENERIC HISTORICAL  12/24/2014   IR RADIOLOGIST EVAL & MGMT 12/24/2014 Irish LackGlenn Yamagata, MD [facility] INTERV RAD  . TONSILLECTOMY AND ADENOIDECTOMY Bilateral 10/11/2016   Procedure: TONSILLECTOMY AND ADENOIDECTOMY;  Surgeon: Flo ShanksKarol Wolicki, MD;  Location: [facility];  Service: ENT;  Laterality: Bilateral;  . TYMPANOSTOMY TUBE PLACEMENT Bilateral    Social History   Socioeconomic History  . Marital status: Single    Spouse name: Not on file  . Number of children: Not on file  . Years of education: Not on file  . Highest education level: Not on file  Occupational History  . Not on file  Social Needs  . Financial resource strain: Not on file  . Food insecurity:    Worry: Not on file    Inability: Not on file  . Transportation needs:    Medical: Not on file    Non-medical: Not on file  Tobacco Use  . Smoking status: Never Smoker  . Smokeless tobacco: Never Used  Substance and Sexual Activity  . Alcohol use: Yes    Comment: occasionally  . Drug use: No  . Sexual activity: Not on file  Lifestyle  . Physical activity:    Days per week: Not on file    Minutes per session: Not on file  . Stress: Not on file  Relationships  . Social connections:    Talks on phone: Not on file    Gets together: Not on file    Attends religious service: Not on file    Active  member of club or organization: Not on file    Attends meetings of clubs or organizations: Not on file    Relationship status: Not on file  Other Topics Concern  . 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 Adderall Xr [Amphetamine-Dextroamphet Er] Other (See Comments)    MOOD CHANGES   Family History  Problem Relation Age of Onset  . Other Father        back problems and sis  . ADD / ADHD Father        maternal aunt 2 cousins   . Allergies Brother   . Anxiety disorder Sister   . Asthma Unknown    No current outpatient medications on file.    Past medical history, social, surgical and family history all reviewed in electronic medical record.  No pertanent information unless stated regarding to the chief complaint.   Review of Systems:  No headache, visual changes, nausea, vomiting, diarrhea, constipation,  dizziness, abdominal pain, skin rash, fevers, chills, night sweats, weight loss, swollen lymph nodes, body aches, joint swelling, muscle aches, chest pain, shortness of breath, mood changes.   Objective  There were no vitals taken for this visit. Systems examined below as of    General: No apparent distress alert and oriented x3 mood and affect normal, dressed appropriately.  HEENT: Pupils equal, extraocular movements intact  Respiratory: Patient's speak in full sentences and does not appear short of breath  Cardiovascular: No lower extremity edema, non tender, no erythema  Skin: Warm dry intact with no signs of infection or rash on extremities or on axial skeleton.  Abdomen: Soft nontender  Neuro: Cranial nerves II through XII are intact, neurovascularly intact in all extremities with 2+ DTRs and 2+ pulses.  Lymph: No lymphadenopathy of posterior  or anterior cervical chain or axillae bilaterally.  Gait normal with good balance and coordination.  MSK:  Non tender with full range of motion and good stability and symmetric strength and tone of shoulders, elbows, wrist, hip, knee and ankles bilaterally.     Impression and Recommendations:     This case required medical decision making of moderate complexity. The above documentation has been reviewed and is accurate and complete Judi Saa, DO       Note: This dictation was prepared with Dragon dictation along with smaller phrase technology. Any transcriptional errors that result from this process are unintentional.

## 2018-09-28 ENCOUNTER — Ambulatory Visit: Payer: BLUE CROSS/BLUE SHIELD | Admitting: Family Medicine

## 2018-09-28 DIAGNOSIS — Z0289 Encounter for other administrative examinations: Secondary | ICD-10-CM
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
## 2018-12-26 ENCOUNTER — Ambulatory Visit (INDEPENDENT_AMBULATORY_CARE_PROVIDER_SITE_OTHER): Payer: BLUE CROSS/BLUE SHIELD

## 2018-12-26 ENCOUNTER — Ambulatory Visit (INDEPENDENT_AMBULATORY_CARE_PROVIDER_SITE_OTHER): Payer: BLUE CROSS/BLUE SHIELD | Admitting: Internal Medicine

## 2018-12-26 ENCOUNTER — Encounter: Payer: Self-pay | Admitting: Internal Medicine

## 2018-12-26 DIAGNOSIS — R072 Precordial pain: Secondary | ICD-10-CM | POA: Diagnosis not present

## 2018-12-26 DIAGNOSIS — S161XXA Strain of muscle, fascia and tendon at neck level, initial encounter: Secondary | ICD-10-CM

## 2018-12-26 DIAGNOSIS — R0789 Other chest pain: Secondary | ICD-10-CM

## 2018-12-26 DIAGNOSIS — M542 Cervicalgia: Secondary | ICD-10-CM | POA: Diagnosis not present

## 2018-12-26 DIAGNOSIS — S299XXA Unspecified injury of thorax, initial encounter: Secondary | ICD-10-CM | POA: Diagnosis not present

## 2018-12-26 NOTE — Patient Instructions (Addendum)
You have  Sternal  bruisng  And neck strain   Ice   And relative rest  . Glad you done have any sx of concussion .   May take  2 weeks or more to  Feel better   Slow increas in activity as tolerated    Cervical Sprain  A cervical sprain is a stretch or tear in one or more of the tough, cord-like tissues that connect bones (ligaments) in the neck. Cervical sprains can range from mild to severe. Severe cervical sprains can cause the spinal bones (vertebrae) in the neck to be unstable. This can lead to spinal cord damage and can result in serious nervous system problems. The amount of time that it takes for a cervical sprain to get better depends on the cause and extent of the injury. Most cervical sprains heal in 4-6 weeks. What are the causes? Cervical sprains may be caused by an injury (trauma), such as from a motor vehicle accident, a fall, or sudden forward and backward whipping movement of the head and neck (whiplash injury). Mild cervical sprains may be caused by wear and tear over time, such as from poor posture, sitting in a chair that does not provide support, or looking up or down for long periods of time. What increases the risk? The following factors may make you more likely to develop this condition:  Participating in activities that have a high risk of trauma to the neck. These include contact sports, auto racing, gymnastics, and diving.  Taking risks when driving or riding in a motor vehicle, such as speeding.  Having osteoarthritis of the spine.  Having poor strength and flexibility of the neck.  A previous neck injury.  Having poor posture.  Spending a lot of time in certain positions that put stress on the neck, such as sitting at a computer for long periods of time. What are the signs or symptoms? Symptoms of this condition include:  Pain, soreness, stiffness, tenderness, swelling, or a burning sensation in the front, back, or sides of the neck.  Sudden tightening  of neck muscles that you cannot control (muscle spasms).  Pain in the shoulders or upper back.  Limited ability to move the neck.  Headache.  Dizziness.  Nausea.  Vomiting.  Weakness, numbness, or tingling in a hand or an arm. Symptoms may develop right away after injury, or they may develop over a few days. In some cases, symptoms may go away with treatment and return (recur) over time. How is this diagnosed? This condition may be diagnosed based on:  Your medical history.  Your symptoms.  Any recent injuries or known neck problems that you have, such as arthritis in the neck.  A physical exam.  Imaging tests, such as: ? X-rays. ? MRI. ? CT scan. How is this treated? This condition is treated by resting and icing the injured area and doing physical therapy exercises. Depending on the severity of your condition, treatment may also include:  Keeping your neck in place (immobilized) for periods of time. This may be done using: ? A cervical collar. This supports your chin and the back of your head. ? A cervical traction device. This is a sling that holds up your head. This removes weight and pressure from your neck, and it may help to relieve pain.  Medicines that help to relieve pain and inflammation.  Medicines that help to relax your muscles (muscle relaxants).  Surgery. This is rare. Follow these instructions at home: If  you have a cervical collar:   Wear it as told by your health care provider. Do not remove the collar unless instructed by your health care provider.  Ask your health care provider before you make any adjustments to your collar.  If you have long hair, keep it outside of the collar.  Ask your health care provider if you can remove the collar for cleaning and bathing. If you are allowed to remove the collar for cleaning or bathing: ? Follow instructions from your health care provider about how to remove the collar safely. ? Clean the collar by  wiping it with mild soap and water and drying it completely. ? If your collar has removable pads, remove them every 1-2 days and wash them by hand with soap and water. Let them air-dry completely before you put them back in the collar. ? Check your skin under the collar for irritation or sores. If you see any, tell your health care provider. Managing pain, stiffness, and swelling   If directed, use a cervical traction device as told by your health care provider.  If directed, apply heat to the affected area before you do your physical therapy or as often as told by your health care provider. Use the heat source that your health care provider recommends, such as a moist heat pack or a heating pad. ? Place a towel between your skin and the heat source. ? Leave the heat on for 20-30 minutes. ? Remove the heat if your skin turns bright red. This is especially important if you are unable to feel pain, heat, or cold. You may have a greater risk of getting burned.  If directed, put ice on the affected area: ? Put ice in a plastic bag. ? Place a towel between your skin and the bag. ? Leave the ice on for 20 minutes, 2-3 times a day. Activity  Do not drive while wearing a cervical collar. If you do not have a cervical collar, ask your health care provider if it is safe to drive while your neck heals.  Do not drive or use heavy machinery while taking prescription pain medicine or muscle relaxants, unless your health care provider approves.  Do not lift anything that is heavier than 10 lb (4.5 kg) until your health care provider tells you that it is safe.  Rest as directed by your health care provider. Avoid positions and activities that make your symptoms worse. Ask your health care provider what activities are safe for you.  If physical therapy was prescribed, do exercises as told by your health care provider or physical therapist. General instructions  Take over-the-counter and prescription  medicines only as told by your health care provider.  Do not use any products that contain nicotine or tobacco, such as cigarettes and e-cigarettes. These can delay healing. If you need help quitting, ask your health care provider.  Keep all follow-up visits as told by your health care provider or physical therapist. This is important. How is this prevented? To prevent a cervical sprain from happening again:  Use and maintain good posture. Make any needed adjustments to your workstation to help you use good posture.  Exercise regularly as directed by your health care provider or physical therapist.  Avoid risky activities that may cause a cervical sprain. Contact a health care provider if:  You have symptoms that get worse or do not get better after 2 weeks of treatment.  You have pain that gets worse or does  not get better with medicine.  You develop new, unexplained symptoms.  You have sores or irritated skin on your neck from wearing your cervical collar. Get help right away if:  You have severe pain.  You develop numbness, tingling, or weakness in any part of your body.  You cannot move a part of your body (you have paralysis).  You have neck pain along with: ? Severe dizziness. ? Headache. Summary  A cervical sprain is a stretch or tear in one or more of the tough, cord-like tissues that connect bones (ligaments) in the neck.  Cervical sprains may be caused by an injury (trauma), such as from a motor vehicle accident, a fall, or sudden forward and backward whipping movement of the head and neck (whiplash injury).  Symptoms may develop right away after injury, or they may develop over a few days.  This condition is treated by resting and icing the injured area and doing physical therapy exercises. This information is not intended to replace advice given to you by your health care provider. Make sure you discuss any questions you have with your health care  provider. Document Released: 08/08/2007 Document Revised: 06/09/2016 Document Reviewed: 06/09/2016 Elsevier Interactive Patient Education  2019 [employer].

## 2019-03-07 ENCOUNTER — Ambulatory Visit (INDEPENDENT_AMBULATORY_CARE_PROVIDER_SITE_OTHER): Payer: BLUE CROSS/BLUE SHIELD | Admitting: Internal Medicine

## 2019-03-07 ENCOUNTER — Encounter: Payer: Self-pay | Admitting: Internal Medicine

## 2019-03-07 ENCOUNTER — Other Ambulatory Visit: Payer: Self-pay

## 2019-03-07 DIAGNOSIS — S99911A Unspecified injury of right ankle, initial encounter: Secondary | ICD-10-CM | POA: Diagnosis not present

## 2019-03-07 DIAGNOSIS — M79671 Pain in right foot: Secondary | ICD-10-CM | POA: Diagnosis not present

## 2019-03-07 NOTE — Progress Notes (Signed)
Virtual Visit via Video Note  I connected with@ on 03/07/19 at 11:30 AM EDT by a video enabled telemedicine application and verified that I am speaking with the correct person using two identifiers. Location patient: home Location provider:work  office Persons participating in the virtual visit: patient, provider  [place] national recommendations  regarding COVID 19 pandemic   video visit is advised over in office visit for this patient.  Patient aware  of the limitations of evaluation and management by telemedicine and  availability of in person appointments. and agreed to proceed.   HPI: Jay Ross presents for video visit   Last night with work out did a kid boxing move and  Hit right foot very hard and off and began to have pain near lateral ankle  And swelling   Home around 8 and    This am hurts and hard to move lateral medial  Can wegight bear . No pop noted when happenied May have remote hx of  rx ? Fibula in past .     ROS: See pertinent positives and negatives per HPI.  Past Medical History:  Diagnosis Date  . ADHD (attention deficit hyperactivity disorder)    no current med.  . Chronic tonsillitis 09/2016   recurrent  . Stuffy nose 10/05/2016    Past Surgical History:  Procedure Laterality Date  . IR GENERIC HISTORICAL  12/24/2014   IR RADIOLOGIST EVAL & MGMT 12/24/2014 Aletta Edouard, MD [facility] INTERV RAD  . TONSILLECTOMY AND ADENOIDECTOMY Bilateral 10/11/2016   Procedure: TONSILLECTOMY AND ADENOIDECTOMY;  Surgeon: Jodi Marble, MD;  Location: [facility];  Service: ENT;  Laterality: Bilateral;  . TYMPANOSTOMY TUBE PLACEMENT Bilateral     Family History  Problem Relation Age of Onset  . Other Father        back problems and sis  . ADD / ADHD Father        maternal aunt 2 cousins   . Allergies Brother   . Anxiety disorder Sister   . Asthma Unknown     Social History   Tobacco Use  . Smoking status: Never Smoker  . Smokeless tobacco: Never  Used  Substance Use Topics  . Alcohol use: Yes    Comment: occasionally  . Drug use: No     No current outpatient medications on file.  EXAM: BP Readings from Last 3 Encounters:  12/26/18 120/72  09/06/18 122/78  08/15/18 122/60    VITALS per patient if applicable:  GENERAL: alert, oriented, appears well and in no acute distress  HEENT: atraumatic, conjunttiva clear, no obvious abnormalities on inspection of external nose and ears  NECK: normal movements of the head and neck  LUNGS: on inspection no signs of respiratory distress, breathing rate appears normal, no obvious gross SOB, gasping or wheezing  CV: no obvious cyanosis  MS: moves all visible extremities right ankle   midl swelling at anterior to lat malleolus no bruising  Tender  To touch   Lateral t alo fib but not on fibula bone  Some tenderness along 5th metatarsal but  Not point  Points to peroneal tendon   nv grossly intact   PSYCH/NEURO: pleasant and cooperative, no obvious depression or anxiety, speech and thought processing grossly intact Lab Results  Component Value Date   WBC 4.8 11/25/2014   HGB 14.9 11/25/2014   HCT 43.7 11/25/2014   PLT 178.0 11/25/2014   GLUCOSE 73 11/25/2014   CHOL 111 11/25/2014   TRIG 88.0 11/25/2014  HDL 37.10 (L) 11/25/2014   LDLCALC 56 11/25/2014   ALT 10 11/25/2014   AST 16 11/25/2014   NA 140 11/25/2014   K 3.9 11/25/2014   CL 103 11/25/2014   CREATININE 1.01 11/25/2014   BUN 10 11/25/2014   CO2 30 11/25/2014   TSH 1.45 11/25/2014    ASSESSMENT AND PLAN:  Discussed the following assessment and plan:  Injury of right ankle, initial encounter - Plan: DG Ankle Complete Right, DG Foot 2 Views Right  Right foot pain - Plan: DG Ankle Complete Right, DG Foot 2 Views Right prob grade 2 lateral ankle sprain r/o met fx etc   Poss peroneal tendon trauma but no rupture by inspection  X ray today if ok ice and immobilization ( says has a boot at home) if not  Improving  some in 5-7 days although may last for weeks   Contact  For poss sports med evaluation.  Counseled.  comeing after 230 today for x ray   Expectant management and discussion of plan and treatment with opportunity to ask questions and all were answered. The patient agreed with the plan and demonstrated an understanding of the instructions.   Advised to call back or seek an in-person evaluation if worsening  or having  further concerns . In interim .      Shanon Ace, MD
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
## 2019-07-12 DIAGNOSIS — M9903 Segmental and somatic dysfunction of lumbar region: Secondary | ICD-10-CM | POA: Diagnosis not present

## 2019-07-12 DIAGNOSIS — M9902 Segmental and somatic dysfunction of thoracic region: Secondary | ICD-10-CM | POA: Diagnosis not present

## 2019-07-12 DIAGNOSIS — M9901 Segmental and somatic dysfunction of cervical region: Secondary | ICD-10-CM | POA: Diagnosis not present

## 2019-07-12 DIAGNOSIS — M9905 Segmental and somatic dysfunction of pelvic region: Secondary | ICD-10-CM | POA: Diagnosis not present

## 2019-07-19 DIAGNOSIS — M9902 Segmental and somatic dysfunction of thoracic region: Secondary | ICD-10-CM | POA: Diagnosis not present

## 2019-07-19 DIAGNOSIS — M9903 Segmental and somatic dysfunction of lumbar region: Secondary | ICD-10-CM | POA: Diagnosis not present

## 2019-07-19 DIAGNOSIS — M9905 Segmental and somatic dysfunction of pelvic region: Secondary | ICD-10-CM | POA: Diagnosis not present

## 2019-07-19 DIAGNOSIS — M9901 Segmental and somatic dysfunction of cervical region: Secondary | ICD-10-CM | POA: Diagnosis not present

## 2019-07-26 DIAGNOSIS — M9903 Segmental and somatic dysfunction of lumbar region: Secondary | ICD-10-CM | POA: Diagnosis not present

## 2019-07-26 DIAGNOSIS — M9905 Segmental and somatic dysfunction of pelvic region: Secondary | ICD-10-CM | POA: Diagnosis not present

## 2019-07-26 DIAGNOSIS — M9902 Segmental and somatic dysfunction of thoracic region: Secondary | ICD-10-CM | POA: Diagnosis not present

## 2019-07-26 DIAGNOSIS — M9901 Segmental and somatic dysfunction of cervical region: Secondary | ICD-10-CM | POA: Diagnosis not present

## 2019-09-19 DIAGNOSIS — N451 Epididymitis: Secondary | ICD-10-CM | POA: Diagnosis not present

## 2019-10-16 DIAGNOSIS — R519 Headache, unspecified: Secondary | ICD-10-CM | POA: Diagnosis not present

## 2019-10-16 DIAGNOSIS — J3489 Other specified disorders of nose and nasal sinuses: Secondary | ICD-10-CM | POA: Diagnosis not present

## 2019-10-16 DIAGNOSIS — Z20828 Contact with and (suspected) exposure to other viral communicable diseases: Secondary | ICD-10-CM | POA: Diagnosis not present

## 2019-10-16 DIAGNOSIS — J029 Acute pharyngitis, unspecified: Secondary | ICD-10-CM | POA: Diagnosis not present

## 2019-10-16 DIAGNOSIS — R509 Fever, unspecified: Secondary | ICD-10-CM | POA: Diagnosis not present

## 2019-10-25 DIAGNOSIS — Z20828 Contact with and (suspected) exposure to other viral communicable diseases: Secondary | ICD-10-CM | POA: Diagnosis not present

## 2020-06-12 DIAGNOSIS — R0989 Other specified symptoms and signs involving the circulatory and respiratory systems: Secondary | ICD-10-CM | POA: Diagnosis not present

## 2020-06-12 DIAGNOSIS — Z20822 Contact with and (suspected) exposure to covid-19: Secondary | ICD-10-CM | POA: Diagnosis not present

## 2020-06-12 DIAGNOSIS — R0981 Nasal congestion: Secondary | ICD-10-CM | POA: Diagnosis not present

## 2020-06-12 DIAGNOSIS — H9202 Otalgia, left ear: Secondary | ICD-10-CM | POA: Diagnosis not present

## 2021-02-28 DIAGNOSIS — M25551 Pain in right hip: Secondary | ICD-10-CM | POA: Diagnosis not present
